# Patient Record
Sex: Female | Born: 1970 | Race: Black or African American | Hispanic: No | Marital: Single | Smoking: Current every day smoker
Health system: Southern US, Community
[De-identification: ages and names within clinical notes are randomized; demographics above are authoritative.]

## PROBLEM LIST (undated history)

## (undated) DIAGNOSIS — D259 Leiomyoma of uterus, unspecified: Secondary | ICD-10-CM

## (undated) DIAGNOSIS — R011 Cardiac murmur, unspecified: Secondary | ICD-10-CM

## (undated) DIAGNOSIS — I10 Essential (primary) hypertension: Secondary | ICD-10-CM

## (undated) DIAGNOSIS — Z973 Presence of spectacles and contact lenses: Secondary | ICD-10-CM

## (undated) DIAGNOSIS — B999 Unspecified infectious disease: Secondary | ICD-10-CM

## (undated) DIAGNOSIS — D649 Anemia, unspecified: Secondary | ICD-10-CM

## (undated) HISTORY — PX: DILATION AND CURETTAGE OF UTERUS: SHX78

---

## 2012-02-22 ENCOUNTER — Encounter (HOSPITAL_COMMUNITY): Payer: Self-pay | Admitting: Emergency Medicine

## 2012-02-22 ENCOUNTER — Emergency Department (HOSPITAL_COMMUNITY): Payer: Self-pay

## 2012-02-22 ENCOUNTER — Emergency Department (HOSPITAL_COMMUNITY)
Admission: EM | Admit: 2012-02-22 | Discharge: 2012-02-22 | Disposition: A | Discharge status: Home / Self Care | Payer: Self-pay | Attending: Emergency Medicine | Admitting: Emergency Medicine

## 2012-02-22 DIAGNOSIS — A599 Trichomoniasis, unspecified: Secondary | ICD-10-CM

## 2012-02-22 DIAGNOSIS — D649 Anemia, unspecified: Secondary | ICD-10-CM

## 2012-02-22 DIAGNOSIS — N200 Calculus of kidney: Secondary | ICD-10-CM

## 2012-02-22 DIAGNOSIS — A499 Bacterial infection, unspecified: Secondary | ICD-10-CM | POA: Insufficient documentation

## 2012-02-22 DIAGNOSIS — B9689 Other specified bacterial agents as the cause of diseases classified elsewhere: Secondary | ICD-10-CM

## 2012-02-22 DIAGNOSIS — R911 Solitary pulmonary nodule: Secondary | ICD-10-CM

## 2012-02-22 DIAGNOSIS — R109 Unspecified abdominal pain: Secondary | ICD-10-CM | POA: Insufficient documentation

## 2012-02-22 DIAGNOSIS — N760 Acute vaginitis: Secondary | ICD-10-CM | POA: Insufficient documentation

## 2012-02-22 LAB — CBC
HCT: 25.6 % — ABNORMAL LOW (ref 36.0–46.0)
Hemoglobin: 6.8 g/dL — CL (ref 12.0–15.0)
MCH: 17.6 pg — ABNORMAL LOW (ref 26.0–34.0)
MCH: 17.9 pg — ABNORMAL LOW (ref 26.0–34.0)
MCHC: 27.2 g/dL — ABNORMAL LOW (ref 30.0–36.0)
MCV: 65.5 fL — ABNORMAL LOW (ref 78.0–100.0)
Platelets: 775 10*3/uL — ABNORMAL HIGH (ref 150–400)
Platelets: 745 10*3/uL — ABNORMAL HIGH (ref 150–400)
RDW: 19.2 % — ABNORMAL HIGH (ref 11.5–15.5)
RDW: 19.4 % — ABNORMAL HIGH (ref 11.5–15.5)

## 2012-02-22 LAB — URINALYSIS, ROUTINE W REFLEX MICROSCOPIC
Protein, ur: 100 mg/dL — AB
Specific Gravity, Urine: 1.023 (ref 1.005–1.030)
Urobilinogen, UA: 0.2 mg/dL (ref 0.0–1.0)

## 2012-02-22 LAB — COMPREHENSIVE METABOLIC PANEL
AST: 13 U/L (ref 0–37)
Albumin: 3.3 g/dL — ABNORMAL LOW (ref 3.5–5.2)
BUN: 6 mg/dL (ref 6–23)
Calcium: 8.8 mg/dL (ref 8.4–10.5)
Creatinine, Ser: 0.6 mg/dL (ref 0.50–1.10)
GFR calc non Af Amer: >90 mL/min (ref >90)

## 2012-02-22 LAB — URINE MICROSCOPIC-ADD ON

## 2012-02-22 LAB — POCT PREGNANCY, URINE: Preg Test, Ur: NEGATIVE

## 2012-02-22 LAB — WET PREP, GENITAL: Yeast Wet Prep HPF POC: NONE SEEN

## 2012-02-22 MED ORDER — METRONIDAZOLE 500 MG PO TABS
2000.0000 mg | ORAL_TABLET | Freq: Once | ORAL | Status: AC
  Administered 2012-02-22: 2000 mg via ORAL
  Filled 2012-02-22: qty 4

## 2012-02-22 MED ORDER — HYDROMORPHONE HCL PF 1 MG/ML IJ SOLN
1.0000 mg | Freq: Once | INTRAMUSCULAR | Status: AC
  Administered 2012-02-22: 1 mg via INTRAVENOUS
  Filled 2012-02-22: qty 1

## 2012-02-22 MED ORDER — ONDANSETRON HCL 4 MG PO TABS: 4.0000 mg | ORAL_TABLET | Freq: Three times a day (TID) | ORAL | Status: AC | PRN

## 2012-02-22 MED ORDER — LIDOCAINE HCL (PF) 1 % IJ SOLN
INTRAMUSCULAR | Status: AC
  Filled 2012-02-22: qty 5

## 2012-02-22 MED ORDER — ONDANSETRON HCL 4 MG/2ML IJ SOLN
4.0000 mg | Freq: Once | INTRAMUSCULAR | Status: AC
  Administered 2012-02-22: 4 mg via INTRAVENOUS
  Filled 2012-02-22: qty 2

## 2012-02-22 MED ORDER — KETOROLAC TROMETHAMINE 10 MG PO TABS: 10.0000 mg | ORAL_TABLET | Freq: Four times a day (QID) | ORAL | Status: AC | PRN

## 2012-02-22 MED ORDER — OXYCODONE-ACETAMINOPHEN 5-325 MG PO TABS: 1.0000 | ORAL_TABLET | ORAL | Status: AC | PRN

## 2012-02-22 MED ORDER — AZITHROMYCIN 250 MG PO TABS
1000.0000 mg | ORAL_TABLET | Freq: Once | ORAL | Status: AC
  Administered 2012-02-22: 1000 mg via ORAL
  Filled 2012-02-22: qty 4

## 2012-02-22 MED ORDER — SODIUM CHLORIDE 0.9 % IV BOLUS (SEPSIS)
1000.0000 mL | Freq: Once | INTRAVENOUS | Status: AC
  Administered 2012-02-22: 1000 mL via INTRAVENOUS

## 2012-02-22 MED ORDER — METRONIDAZOLE 500 MG PO TABS: 500.0000 mg | ORAL_TABLET | Freq: Two times a day (BID) | ORAL | Status: AC

## 2012-02-22 MED ORDER — TAMSULOSIN HCL 0.4 MG PO CAPS: 0.4000 mg | ORAL_CAPSULE | Freq: Two times a day (BID) | ORAL | Status: DC

## 2012-02-22 MED ORDER — CEFTRIAXONE SODIUM 250 MG IJ SOLR
125.0000 mg | Freq: Once | INTRAMUSCULAR | Status: AC
  Administered 2012-02-22: 125 mg via INTRAMUSCULAR
  Filled 2012-02-22: qty 250

## 2012-02-22 NOTE — ED Notes (Signed)
The pt is c/o lt sided abd pain since 0045 this am with nv and diarrhea.  lmp now.  At present no distress.  No vaginal discharge.  Alert oriented skin warm and dry

## 2012-02-22 NOTE — ED Notes (Signed)
C/o L sided abd pain with n/v/d since 12:45pm today.  Denies urinary complaints.

## 2012-02-22 NOTE — ED Notes (Signed)
Up to the br with assistance.  zithromax not given at this time .  Nausea is a large factor

## 2012-02-22 NOTE — ED Provider Notes (Signed)
History     CSN: [id number]  Arrival date & time 02/22/12  1558   First MD Initiated Contact with Patient 02/22/12 1838      Chief Complaint  Patient presents with  . Abdominal Pain    (Consider location/radiation/quality/duration/timing/severity/associated sxs/prior treatment) The history is provided by the patient.    41 y/o female INAD c/o left flank and LUQ pain followed by 3x episodes NBNB vomiting and diarrhea onset this morning. Denies Sick contacts, and recent travel. Affirms subjective fever and chills. Patient has had several transfusions in the past. She normally takes iron supplementation but has not taken it recently. She is very heavy periods/  History reviewed. No pertinent past medical history.  Past Surgical History  Procedure Date  . Tubal ligation   . Cesarean section     No family history on file.  History  Substance Use Topics  . Smoking status: Current Everyday Smoker  . Smokeless tobacco: Not on file  . Alcohol Use: Yes    OB History    Grav Para Term Preterm Abortions TAB SAB Ect Mult Living                  Review of Systems  Constitutional: Positive for fever and chills.  Cardiovascular: Negative for chest pain.  Gastrointestinal: Negative for abdominal pain.  Genitourinary: Positive for flank pain. Negative for dysuria and frequency.  Skin: Negative for rash.  All other systems reviewed and are negative.    Allergies  Review of patient's allergies indicates no known allergies.  Home Medications   Current Outpatient Rx  Name Route Sig Dispense Refill  . NAPROXEN SODIUM 220 MG PO TABS Oral Take 220 mg by mouth 2 (two) times daily with a meal. For pain      BP 141/70  Pulse 96  Temp 98.6 F (37 C) (Oral)  Resp 16  SpO2 100%  LMP 02/18/2012  Physical Exam  Vitals reviewed. Constitutional: She is oriented to person, place, and time. She appears well-developed and well-nourished. No distress.  HENT:  Head: Normocephalic.    Eyes: Conjunctivae and EOM are normal. Pupils are equal, round, and reactive to light.  Neck: Normal range of motion.  Cardiovascular: Normal rate, regular rhythm and normal heart sounds.   Pulmonary/Chest: Effort normal and breath sounds normal.  Abdominal: Bowel sounds are normal. She exhibits no distension and no mass. There is no tenderness. There is no rebound and no guarding.       Mild left CVA tenderness.  Genitourinary: There is no rash, tenderness or lesion on the right labia. There is no rash, tenderness or lesion on the left labia. Uterus is not enlarged. Cervix exhibits no motion tenderness, no discharge and no friability. Right adnexum displays no mass, no tenderness and no fullness. Left adnexum displays no mass, no tenderness and no fullness. No erythema or tenderness around the vagina. No vaginal discharge found.  Musculoskeletal: Normal range of motion.  Neurological: She is alert and oriented to person, place, and time.  Psychiatric: She has a normal mood and affect.    ED Course  Procedures (including critical care time)  Labs Reviewed  CBC - Abnormal; Notable for the following:    WBC 13.6 (*)     Hemoglobin 6.9 (*)     HCT 25.6 (*)     MCV 65.5 (*)     MCH 17.6 (*)     MCHC 27.0 (*)     RDW 19.2 (*)  Platelets 775 (*)     All other components within normal limits  COMPREHENSIVE METABOLIC PANEL - Abnormal; Notable for the following:    Glucose, Bld 175 (*)     Albumin 3.3 (*)     Total Bilirubin 0.1 (*)     All other components within normal limits  URINALYSIS, ROUTINE W REFLEX MICROSCOPIC - Abnormal; Notable for the following:    APPearance CLOUDY (*)     Glucose, UA 100 (*)     Hgb urine dipstick LARGE (*)     Protein, ur 100 (*)     Leukocytes, UA MODERATE (*)     All other components within normal limits  URINE MICROSCOPIC-ADD ON - Abnormal; Notable for the following:    Squamous Epithelial / LPF MANY (*)     Bacteria, UA FEW (*)     All other  components within normal limits  POCT PREGNANCY, URINE   Ct Abdomen Pelvis Wo Contrast  02/22/2012  *RADIOLOGY REPORT*  Clinical Data: Abdominal pain.  Left-sided flank pain.  Nausea and vomiting.  CT ABDOMEN AND PELVIS WITHOUT CONTRAST  Technique:  Multidetector CT imaging of the abdomen and pelvis was performed following the standard protocol without intravenous contrast.  Comparison: No priors.  Findings:  Lung Bases: Two small pulmonary nodules in the left base, the largest of which measures 5 mm (image 13 of series 102) in the anterior aspect of the left lower lobe.  There is also a 4 mm nodule in the periphery of the left lower lobe (image four of series 102).  Abdomen/Pelvis:  Image 85 of series 2 demonstrates a tiny 3 mm calculus at the left ureterovesicular junction.  There is mild proximal left hydroureteronephrosis and a small amount of left- sided perinephric stranding, indicative of mild obstruction.  On the right, there is a 4 mm calculus in the lower pole collecting system of the right kidney, but no right ureteral calculi or signs of right-sided obstruction.  No calculi within the lumen of the urinary bladder.  The unenhanced appearance of the liver, gallbladder, pancreas, spleen and bilateral adrenal glands is unremarkable.  Normal appendix.  No ascites or pneumoperitoneum and no pathologic distension of bowel.  No definite pathologic lymphadenopathy identified within the abdomen or pelvis on this noncontrast CT examination.  The uterus appears enlarged and slightly heterogeneous in appearance, likely related to fibroids.  Ovaries are not well visualized.  Musculoskeletal: There are no aggressive appearing lytic or blastic lesions noted in the visualized portions of the skeleton.  IMPRESSION: 1.  3 mm partially obstructing calculus at the left ureterovesicular junction with mild left-sided hydroureteronephrosis and perinephric stranding. 2.  4 mm nonobstructive calculus in the lower pole  collecting system of the right kidney. 3.  Probable fibroid uterus. 4.  Normal appendix. 5.  Nonspecific 4 mm and 5 mm pulmonary nodules in the left lower lobe, as above. If the patient is at high risk for bronchogenic carcinoma, follow-up chest CT at 6-12 months is recommended.  If the patient is at low risk for bronchogenic carcinoma, follow-up chest CT at 12 months is recommended.  This recommendation follows the consensus statement: Guidelines for Management of Small Pulmonary Nodules Detected on CT Scans: A Statement from the Fleischner Society as published in Radiology 2005; 237:395-400.  Original Report Authenticated By: Florencia Reasons, M.D.     1. Kidney stone   2. Anemia   3. Bacterial vaginosis   4. Trichomoniasis   5. Pulmonary nodule  MDM  Suspect viral gastroenteritis vs Kidney stone as pain is more in flank radiating to LUQ.   UA contaminated, I'll obtain stone protocol CT. patient's pain and nausea are well controlled.  Urinalysis shows Trichomonas, I will treat this with 2 g of metronidazole. Pelvic exam clinically normal with no cervical motion tenderness. Wet prep shows white blood cells and clue cells. For this reason I will treat her for cervicitis, and bacterial vaginosis.  Repeat CBC pending.  Repeat HGB 6.8, S. patient is asymptomatic at this time there is no need for transfusion. I will advise the patient to resume taking her iron supplementation.  CT showed pulmonary nodules. Patient is a smoker spent extensive time counseling on patient smoking cessation I advised patient that she needed a followup CT in 6 months.  Extensive discharge counseling was spent explaining her diagnoses and medications, return precautions and appropriate followups. Patient repeated to me return precautions and time frame for reevaluation. Pt verbalized understanding and agrees with care plan. Outpatient follow-up and return precautions given.             Wynetta Emery, PA-C 02/23/12 [phone]

## 2012-02-22 NOTE — ED Notes (Signed)
The pt says her hgb is always low.

## 2012-02-22 NOTE — ED Notes (Signed)
The pt just returned from xray 

## 2012-02-22 NOTE — ED Notes (Signed)
Pain and nausea med given 

## 2012-02-22 NOTE — ED Notes (Signed)
Per lab, critical Hgb 6.9.

## 2012-02-22 NOTE — ED Notes (Signed)
The pt was given po meds gingerale given

## 2012-02-22 NOTE — ED Notes (Signed)
Pelvic cart at the bedside 

## 2012-02-22 NOTE — ED Notes (Signed)
The pts pain is better 

## 2012-02-23 LAB — GC/CHLAMYDIA PROBE AMP, GENITAL: Chlamydia, DNA Probe: NEGATIVE

## 2012-02-23 LAB — RPR: RPR Ser Ql: NONREACTIVE

## 2012-02-23 NOTE — ED Provider Notes (Signed)
Medical screening examination/treatment/procedure(s) were performed by non-physician practitioner and as supervising physician I was immediately available for consultation/collaboration.  Ethelda Chick, MD 02/23/12 [phone]

## 2014-03-04 ENCOUNTER — Ambulatory Visit (INDEPENDENT_AMBULATORY_CARE_PROVIDER_SITE_OTHER): Payer: Self-pay | Admitting: Cardiology

## 2014-03-04 ENCOUNTER — Encounter: Payer: Self-pay | Admitting: Cardiology

## 2014-03-04 ENCOUNTER — Ambulatory Visit: Payer: Self-pay | Admitting: Cardiology

## 2014-03-04 VITALS — BP 160/90 | HR 105 | Ht 62.0 in | Wt 193.0 lb

## 2014-03-04 DIAGNOSIS — R011 Cardiac murmur, unspecified: Secondary | ICD-10-CM | POA: Insufficient documentation

## 2014-03-04 DIAGNOSIS — I10 Essential (primary) hypertension: Secondary | ICD-10-CM

## 2014-03-04 NOTE — Progress Notes (Signed)
   HPI The patient was referred for evaluation of a murmur. 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  She says she's never had any cardiac workup. She doesn't exercise routinely but she is active particularly in her job. With this she denies any cardiovascular symptoms.  The patient denies any new symptoms such as chest discomfort, neck or arm discomfort. There has been no new shortness of breath, PND or orthopnea. There have been no reported palpitations, presyncope or syncope.  No Known Allergies  Meds:  None    Past Medical History  Diagnosis Date  . HTN (hypertension)     Borderline    Past Surgical History  Procedure Laterality Date  . Tubal ligation    . Cesarean section      Family History  Problem Relation Age of Onset  . Heart disease Mother     Pacemaker  . Hypertension Mother     History   Social History  . Marital Status: Single    Spouse Name: N/A    Number of Children: 3  . Years of Education: N/A   Occupational History  . Not on file.   Social History Main Topics  . Smoking status: Current Every Day Smoker -- 0.50 packs/day for 25 years    Types: Cigarettes  . Smokeless tobacco: Not on file  . Alcohol Use: Yes  . Drug Use: No  . Sexual Activity: Not on file   Other Topics Concern  . 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     ROS:  As stated in the HPI and negative for all other systems.   PHYSICAL EXAM BP 160/90  Pulse 105  Ht 5\' 2"  (1.575 m)  Wt 193 lb (87.544 kg)  BMI 35.29 kg/m2 GENERAL:  Well appearing HEENT:  Pupils equal round and reactive, fundi not visualized, oral mucosa unremarkable NECK:  No jugular venous distention, waveform within normal limits, carotid upstroke brisk and symmetric, no bruits, no thyromegaly LYMPHATICS:  No cervical, inguinal adenopathy LUNGS:  Clear to auscultation  bilaterally BACK:  No CVA tenderness CHEST:  Unremarkable HEART:  PMI not displaced or sustained,S1 and S2 within normal limits, no S3, no S4, no clicks, no rubs,  2/6 apical early peaking systolic murmur heard best  At the apex and right upper sternal border but without changing Valsalva, n diastolic murmurs ABD:  Flat, positive bowel sounds normal in frequency in pitch, no bruits, no rebound, no guarding, no midline pulsatile mass, no hepatomegaly, no splenomegaly EXT:  2 plus pulses throughout, no edema, no cyanosis no clubbing SKIN:  No rashes no nodules NEURO:  Cranial nerves II through XII grossly intact, motor grossly intact throughout PSYCH:  Cognitively intact, oriented to person place and time   EKG:   Sinus rhythm, rate 105 , axis within normal limits , intervals within normal limits, no specific T-wave flattening. h  ASSESSMENT AND PLAN  MURMUR:   We will followup with an echocardiogram. 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  HTN:   Her blood pressure is slightly elevated and she will keep a blood pressure diary. We talked about therapeutic lifestyle changes.  TOBACCO ABUSE:   We did discuss the need to stop smoking and strategies.

## 2014-03-04 NOTE — Patient Instructions (Signed)
Your physician recommends that you schedule a follow-up appointment in: one year with Dr. Percival Spanish  We are ordering an echo to look into your heart murmur

## 2014-03-05 ENCOUNTER — Other Ambulatory Visit (HOSPITAL_COMMUNITY): Payer: Self-pay | Admitting: Cardiology

## 2014-03-05 ENCOUNTER — Ambulatory Visit (HOSPITAL_COMMUNITY): Payer: Self-pay | Attending: Cardiology | Admitting: Cardiology

## 2014-03-05 DIAGNOSIS — R011 Cardiac murmur, unspecified: Secondary | ICD-10-CM

## 2014-03-05 DIAGNOSIS — I519 Heart disease, unspecified: Secondary | ICD-10-CM | POA: Insufficient documentation

## 2014-03-05 HISTORY — PX: TRANSTHORACIC ECHOCARDIOGRAM: SHX275

## 2014-03-05 NOTE — Progress Notes (Signed)
Echo performed. 

## 2014-03-08 ENCOUNTER — Telehealth: Payer: Self-pay | Admitting: Internal Medicine

## 2014-03-08 NOTE — Telephone Encounter (Signed)
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 She would like it as soon as possible so she can return to work. Please call  Thanks

## 2014-03-08 NOTE — Telephone Encounter (Signed)
Spoke with pt, copy of dr Debara Pickett ov from 03-04-14 and copy of recent echo placed at the front desk for patient pick up.

## 2014-07-23 DIAGNOSIS — R011 Cardiac murmur, unspecified: Secondary | ICD-10-CM

## 2014-07-23 HISTORY — DX: Cardiac murmur, unspecified: R01.1

## 2015-03-15 ENCOUNTER — Ambulatory Visit (INDEPENDENT_AMBULATORY_CARE_PROVIDER_SITE_OTHER): Payer: Commercial Managed Care - PPO | Admitting: Cardiology

## 2015-03-15 ENCOUNTER — Encounter: Payer: Self-pay | Admitting: Cardiology

## 2015-03-15 VITALS — BP 124/84 | HR 103 | Ht 62.0 in | Wt 200.6 lb

## 2015-03-15 DIAGNOSIS — I10 Essential (primary) hypertension: Secondary | ICD-10-CM | POA: Diagnosis not present

## 2015-03-15 DIAGNOSIS — R011 Cardiac murmur, unspecified: Secondary | ICD-10-CM | POA: Diagnosis not present

## 2015-03-15 NOTE — Progress Notes (Signed)
   HPI The patient was referred for evaluation of a murmur. I saw her last year for this. Echocardiogram was unremarkable. In the past year she's had no new cardiovascular events. The patient denies any new symptoms such as chest discomfort, neck or arm discomfort. There has been no new shortness of breath, PND or orthopnea. There have been no reported palpitations, presyncope or syncope.  She did stop smoking for a while but had some stress in her life and started again.    No Known Allergies  Meds:  Iron    Past Medical History  Diagnosis Date  . HTN (hypertension)     Borderline    Past Surgical History  Procedure Laterality Date  . Tubal ligation    . Cesarean section     ROS:  As stated in the HPI and negative for all other systems.   PHYSICAL EXAM BP 124/84 mmHg  Pulse 103  Ht 5\' 2"  (1.575 m)  Wt 200 lb 9 oz (90.975 kg)  BMI 36.67 kg/m2  LMP 03/15/2015 HEENT:  Pupils equal round and reactive, fundi not visualized, oral mucosa unremarkable NECK:  No jugular venous distention, waveform within normal limits, carotid upstroke brisk and symmetric, no bruits, no thyromegaly LUNGS:  Clear to auscultation bilaterally BACK:  No CVA tenderness HEART:  PMI not displaced or sustained,S1 and S2 within normal limits, no S3, no S4, no clicks, no rubs,  2/6 apical early peaking systolic murmur heard best  At the apex and right upper sternal border but without changing Valsalva, no diastolic murmurs ABD:  Flat, positive bowel sounds normal in frequency in pitch, no bruits, no rebound, no guarding, no midline pulsatile mass, no hepatomegaly, no splenomegaly EXT:  2 plus pulses throughout, no edema, no cyanosis no clubbing   EKG:   Sinus rhythm, rate 103, axis within normal limits , intervals within normal limits, no specific T-wave flattening. h  ASSESSMENT AND PLAN  MURMUR:  This sounds like a flow murmur. The echocardiogram last she was unremarkable. No further imaging is  indicated.  HTN:   The blood pressure is well controlled. No change in therapy is indicated.  TOBACCO ABUSE:   The patient again will try to stop smoking and we discussed strategies.

## 2015-03-15 NOTE — Patient Instructions (Signed)
Your physician recommends that you schedule a follow-up appointment in: As Needed    

## 2015-03-31 ENCOUNTER — Encounter (HOSPITAL_BASED_OUTPATIENT_CLINIC_OR_DEPARTMENT_OTHER): Payer: Self-pay | Admitting: *Deleted

## 2015-04-01 ENCOUNTER — Encounter (HOSPITAL_BASED_OUTPATIENT_CLINIC_OR_DEPARTMENT_OTHER): Payer: Self-pay | Admitting: *Deleted

## 2015-04-01 NOTE — H&P (Signed)
NAMETONIANNE, Christie [name] NO.:  [id number]  MEDICAL RECORD NO.:  [id number]  LOCATION:                               FACILITY:  [facility]  PHYSICIAN:  Darlyn Chamber, M.D.   DATE OF BIRTH:  06/28/1971  DATE OF ADMISSION:  04/06/2015 DATE OF DISCHARGE:                             HISTORY & PHYSICAL   DATE OF SURGERY:  September 14.  The surgery is at [facility].  The patient is a 44 year old, gravida 3, para 9 female, who presents for hysteroscopy with my MyoSure resection of a possible intrauterine fibroid as well as hydrothermal ablation.  The patient has been having trouble with increasing menstrual flow.  She has 5 days of flow.  Three days were heavy changing pads every hour with clotting and cramping. She has required a transfusion in the past for this.  She has had a previous bilateral tubal ligation.  We did do an ultrasound here in the office.  Ultrasound did show a fundal fibroid measuring 6.7 cm.  She had other smaller intramural fibroids and she had one inside the uterine cavity, measuring 1.5 cm.  She now presents for hysteroscopy with MyoSure resection of the fibroid with subsequent hydrothermal ablation.  ALLERGIES:  She has no known drug allergies.  MEDICATIONS:  Include; iron sulfate supplementation.  PAST MEDICAL HISTORY:  Usual childhood diseases.  No significant sequelae.  She has had 3 prior cesarean sections and a bilateral tubal ligation with the last one.  SOCIAL HISTORY:  Half pack per day tobacco use.  No alcohol use.  FAMILY HISTORY:  Noncontributory.  REVIEW OF SYSTEMS:  Noncontributory.  PHYSICAL EXAMINATION:  VITAL SIGNS:  The patient is afebrile with stable vital signs. HEENT:  The patient is normocephalic.  Pupils equal, round, and reactive to light and accommodation.  Extraocular is intact.  Sclerae clear. Oropharynx clear. NECK:  Without thyromegaly. BREASTS:  Not examined. LUNGS:   Clear. CARDIAC SYSTEM:  Regular rate.  No murmurs or gallops. ABDOMEN:  Benign.  No mass, organomegaly, or tenderness. PELVIC:  External genitalia is clear.  Cervix unremarkable.  Uterus approximately 9 weeks in size.  Adnexa unremarkable.  IMPRESSION:  Menorrhagia, secondary to uterine fibroid with a possible intrauterine fibroids.  PLAN:  The patient will undergo hysteroscopy with a MyoSure resection of the fibroid along with hydrothermal ablation.  The nature of the procedure have been discussed.  Risks have been explained, including the risk of infection.  The risk of hemorrhage that could require transfusion with the risks of AIDS or hepatitis.  Excessive bleeding could require hysterectomy.  There is a risk of perforation of injury to adjacent organs such as bowel that could require further exploratory surgery.  Risk of deep venous thrombosis and pulmonary limits and pulmonary embolus.  Other alternatives have been discussed.  This could include the use of the IUD versus hysterectomy.  Right now, she will proceed with the above-noted surgery as mentioned.  She does understand potential risks and complications.     Darlyn Chamber, M.D.     JSM/MEDQ  D:  04/01/2015  T:  04/01/2015  Job:  [id number]

## 2015-04-01 NOTE — Progress Notes (Signed)
NPO AFTER MN.  ARRIVE AT 0700. PT TO GET LAB WORK DONE ON TUESDAY 04-05-2015 1430.

## 2015-04-01 NOTE — H&P (Signed)
  Patient name Christie, [name] DICTATION# [id number] CSN# [id number]  [facility], MD 04/01/2015 7:49 AM

## 2015-04-05 NOTE — Anesthesia Preprocedure Evaluation (Addendum)
Anesthesia Evaluation  Patient identified by MRN, date of birth, ID band Patient awake    Reviewed: Allergy & Precautions, H&P , NPO status , Patient's Chart, lab work & pertinent test results  Airway Mallampati: II  TM Distance: >3 FB Neck ROM: full    Dental  (+) Dental Advisory Given, Chipped Large chip left front upper tooth:   Pulmonary Current Smoker,    Pulmonary exam normal breath sounds clear to auscultation       Cardiovascular Exercise Tolerance: Good hypertension, Normal cardiovascular exam Rhythm:regular Rate:Normal     Neuro/Psych negative neurological ROS  negative psych ROS   GI/Hepatic negative GI ROS, Neg liver ROS,   Endo/Other  negative endocrine ROS  Renal/GU negative Renal ROS  negative genitourinary   Musculoskeletal   Abdominal   Peds  Hematology negative hematology ROS (+)   Anesthesia Other Findings   Reproductive/Obstetrics negative OB ROS                            Anesthesia Physical Anesthesia Plan  ASA: II  Anesthesia Plan: General   Post-op Pain Management:    Induction: Intravenous  Airway Management Planned: LMA  Additional Equipment:   Intra-op Plan:   Post-operative Plan:   Informed Consent: I have reviewed the patients History and Physical, chart, labs and discussed the procedure including the risks, benefits and alternatives for the proposed anesthesia with the patient or authorized representative who has indicated his/her understanding and acceptance.   Dental Advisory Given  Plan Discussed with: CRNA and Surgeon  Anesthesia Plan Comments:         Anesthesia Quick Evaluation

## 2015-04-06 ENCOUNTER — Encounter (HOSPITAL_BASED_OUTPATIENT_CLINIC_OR_DEPARTMENT_OTHER)
Admission: RE | Disposition: A | Discharge status: Home / Self Care | Payer: Self-pay | Source: Ambulatory Visit | Attending: Obstetrics and Gynecology

## 2015-04-06 ENCOUNTER — Ambulatory Visit (HOSPITAL_BASED_OUTPATIENT_CLINIC_OR_DEPARTMENT_OTHER)
Admission: RE | Admit: 2015-04-06 | Discharge: 2015-04-06 | Disposition: A | Discharge status: Home / Self Care | Payer: Commercial Managed Care - PPO | Source: Ambulatory Visit | Attending: Obstetrics and Gynecology | Admitting: Obstetrics and Gynecology

## 2015-04-06 ENCOUNTER — Encounter (HOSPITAL_BASED_OUTPATIENT_CLINIC_OR_DEPARTMENT_OTHER): Payer: Self-pay | Admitting: *Deleted

## 2015-04-06 ENCOUNTER — Ambulatory Visit (HOSPITAL_BASED_OUTPATIENT_CLINIC_OR_DEPARTMENT_OTHER): Payer: Commercial Managed Care - PPO | Admitting: Anesthesiology

## 2015-04-06 DIAGNOSIS — N840 Polyp of corpus uteri: Secondary | ICD-10-CM | POA: Insufficient documentation

## 2015-04-06 DIAGNOSIS — N920 Excessive and frequent menstruation with regular cycle: Secondary | ICD-10-CM | POA: Diagnosis present

## 2015-04-06 DIAGNOSIS — I10 Essential (primary) hypertension: Secondary | ICD-10-CM | POA: Insufficient documentation

## 2015-04-06 DIAGNOSIS — F17200 Nicotine dependence, unspecified, uncomplicated: Secondary | ICD-10-CM | POA: Diagnosis not present

## 2015-04-06 DIAGNOSIS — D250 Submucous leiomyoma of uterus: Secondary | ICD-10-CM | POA: Insufficient documentation

## 2015-04-06 DIAGNOSIS — D259 Leiomyoma of uterus, unspecified: Secondary | ICD-10-CM | POA: Diagnosis present

## 2015-04-06 HISTORY — DX: Anemia, unspecified: D64.9

## 2015-04-06 HISTORY — DX: Presence of spectacles and contact lenses: Z97.3

## 2015-04-06 HISTORY — DX: Leiomyoma of uterus, unspecified: D25.9

## 2015-04-06 HISTORY — PX: DILITATION & CURRETTAGE/HYSTROSCOPY WITH HYDROTHERMAL ABLATION: SHX5570

## 2015-04-06 HISTORY — DX: Cardiac murmur, unspecified: R01.1

## 2015-04-06 LAB — CBC
HEMATOCRIT: 35.1 % — AB (ref 36.0–46.0)
HEMOGLOBIN: 10.8 g/dL — AB (ref 12.0–15.0)
MCH: 25.2 pg — AB (ref 26.0–34.0)
MCHC: 30.8 g/dL (ref 30.0–36.0)
MCV: 82 fL (ref 78.0–100.0)
Platelets: 368 10*3/uL (ref 150–400)
RBC: 4.28 MIL/uL (ref 3.87–5.11)
WBC: 9.1 10*3/uL (ref 4.0–10.5)

## 2015-04-06 LAB — HCG, SERUM, QUALITATIVE: Preg, Serum: NEGATIVE

## 2015-04-06 SURGERY — DILATATION & CURETTAGE/HYSTEROSCOPY WITH HYDROTHERMAL ABLATION
Anesthesia: General

## 2015-04-06 MED ORDER — FENTANYL CITRATE (PF) 100 MCG/2ML IJ SOLN
25.0000 ug | INTRAMUSCULAR | Status: DC | PRN
  Filled 2015-04-06: qty 1

## 2015-04-06 MED ORDER — LACTATED RINGERS IV SOLN
INTRAVENOUS | Status: DC
  Administered 2015-04-06: 07:00:00 via INTRAVENOUS
  Filled 2015-04-06: qty 1000

## 2015-04-06 MED ORDER — ONDANSETRON HCL 4 MG/2ML IJ SOLN
INTRAMUSCULAR | Status: DC | PRN
Start: 2015-04-06 — End: 2015-04-06
  Administered 2015-04-06: 4 mg via INTRAVENOUS

## 2015-04-06 MED ORDER — GLYCINE 1.5 % IR SOLN
Status: DC | PRN
  Administered 2015-04-06: 3000 mL

## 2015-04-06 MED ORDER — DEXAMETHASONE SODIUM PHOSPHATE 4 MG/ML IJ SOLN
INTRAMUSCULAR | Status: DC | PRN
  Administered 2015-04-06: 10 mg via INTRAVENOUS

## 2015-04-06 MED ORDER — LIDOCAINE HCL (CARDIAC) 20 MG/ML IV SOLN
INTRAVENOUS | Status: DC | PRN
  Administered 2015-04-06: 80 mg via INTRAVENOUS

## 2015-04-06 MED ORDER — ACETAMINOPHEN 10 MG/ML IV SOLN
INTRAVENOUS | Status: DC | PRN
  Administered 2015-04-06: 1000 mg via INTRAVENOUS

## 2015-04-06 MED ORDER — FENTANYL CITRATE (PF) 100 MCG/2ML IJ SOLN
INTRAMUSCULAR | Status: AC
  Filled 2015-04-06: qty 4

## 2015-04-06 MED ORDER — FENTANYL CITRATE (PF) 100 MCG/2ML IJ SOLN
INTRAMUSCULAR | Status: DC | PRN
  Administered 2015-04-06 (×2): 50 ug via INTRAVENOUS

## 2015-04-06 MED ORDER — LIDOCAINE-EPINEPHRINE 1 %-1:100000 IJ SOLN
INTRAMUSCULAR | Status: DC | PRN
  Administered 2015-04-06: 20 mL

## 2015-04-06 MED ORDER — PROPOFOL 10 MG/ML IV BOLUS
INTRAVENOUS | Status: DC | PRN
  Administered 2015-04-06: 250 mg via INTRAVENOUS

## 2015-04-06 MED ORDER — LACTATED RINGERS IV SOLN
INTRAVENOUS | Status: DC
  Filled 2015-04-06: qty 1000

## 2015-04-06 MED ORDER — CEFAZOLIN SODIUM-DEXTROSE 2-3 GM-% IV SOLR
2.0000 g | INTRAVENOUS | Status: AC
  Administered 2015-04-06: 2 g via INTRAVENOUS
  Filled 2015-04-06: qty 50

## 2015-04-06 MED ORDER — MIDAZOLAM HCL 5 MG/5ML IJ SOLN
INTRAMUSCULAR | Status: DC | PRN
  Administered 2015-04-06: 2 mg via INTRAVENOUS

## 2015-04-06 MED ORDER — KETOROLAC TROMETHAMINE 30 MG/ML IJ SOLN
INTRAMUSCULAR | Status: DC | PRN
Start: 2015-04-06 — End: 2015-04-06
  Administered 2015-04-06: 30 mg via INTRAVENOUS

## 2015-04-06 MED ORDER — OXYCODONE-ACETAMINOPHEN 7.5-325 MG PO TABS: 1.0000 | ORAL_TABLET | ORAL | Status: DC | PRN

## 2015-04-06 MED ORDER — MIDAZOLAM HCL 2 MG/2ML IJ SOLN
INTRAMUSCULAR | Status: AC
Start: 2015-04-06 — End: 2015-04-06
  Filled 2015-04-06: qty 2

## 2015-04-06 MED ORDER — CEFAZOLIN SODIUM-DEXTROSE 2-3 GM-% IV SOLR
INTRAVENOUS | Status: AC
  Filled 2015-04-06: qty 50

## 2015-04-06 SURGICAL SUPPLY — 35 items
ABLATOR ENDOMETRIAL BIPOLAR (ABLATOR) ×3 IMPLANT
CANISTER SUCTION 2500CC (MISCELLANEOUS) ×3 IMPLANT
CATH ROBINSON RED A/P 16FR (CATHETERS) ×3 IMPLANT
CORD ACTIVE DISPOSABLE (ELECTRODE)
CORD ELECTRO ACTIVE DISP (ELECTRODE) IMPLANT
COVER BACK TABLE 60X90IN (DRAPES) ×3 IMPLANT
DEVICE MYOSURE LITE (MISCELLANEOUS) ×3 IMPLANT
DRAPE HYSTEROSCOPY (DRAPE) ×3 IMPLANT
DRAPE LG THREE QUARTER DISP (DRAPES) ×3 IMPLANT
DRSG TELFA 3X8 NADH (GAUZE/BANDAGES/DRESSINGS) ×3 IMPLANT
ELECT LOOP GYNE PRO 24FR (CUTTING LOOP)
ELECT REM PT RETURN 9FT ADLT (ELECTROSURGICAL) ×3
ELECT VAPORTRODE GRVD BAR (ELECTRODE) IMPLANT
ELECTRODE LOOP GYNE PRO 24FR (CUTTING LOOP) IMPLANT
ELECTRODE REM PT RTRN 9FT ADLT (ELECTROSURGICAL) ×1 IMPLANT
ELECTRODE ROLLER BARREL 22FR (ELECTROSURGICAL) IMPLANT
GLOVE BIO SURGEON STRL SZ 6.5 (GLOVE) ×2 IMPLANT
GLOVE BIO SURGEON STRL SZ7 (GLOVE) ×6 IMPLANT
GLOVE BIO SURGEONS STRL SZ 6.5 (GLOVE) ×1
GLOVE BIOGEL M 6.5 STRL (GLOVE) ×3 IMPLANT
GLOVE BIOGEL PI IND STRL 7.5 (GLOVE) ×1 IMPLANT
GLOVE BIOGEL PI INDICATOR 7.5 (GLOVE) ×2
GOWN STRL REUS W/ TWL LRG LVL3 (GOWN DISPOSABLE) ×2 IMPLANT
GOWN STRL REUS W/TWL LRG LVL3 (GOWN DISPOSABLE) ×4
LEGGING LITHOTOMY PAIR STRL (DRAPES) ×3 IMPLANT
NEEDLE SPNL 18GX3.5 QUINCKE PK (NEEDLE) ×3 IMPLANT
PACK BASIN DAY SURGERY FS (CUSTOM PROCEDURE TRAY) ×3 IMPLANT
PAD OB MATERNITY 4.3X12.25 (PERSONAL CARE ITEMS) ×3 IMPLANT
PAD PREP 24X48 CUFFED NSTRL (MISCELLANEOUS) ×3 IMPLANT
SET GENESYS HTA PROCERVA (MISCELLANEOUS) ×3 IMPLANT
SYR CONTROL 10ML LL (SYRINGE) ×3 IMPLANT
TOWEL OR 17X24 6PK STRL BLUE (TOWEL DISPOSABLE) ×6 IMPLANT
TUBING AQUILEX INFLOW (TUBING) ×3 IMPLANT
TUBING AQUILEX OUTFLOW (TUBING) ×3 IMPLANT
WATER STERILE IRR 500ML POUR (IV SOLUTION) ×3 IMPLANT

## 2015-04-06 NOTE — H&P (Signed)
  History and physical exam unchanged 

## 2015-04-06 NOTE — Anesthesia Procedure Notes (Signed)
Procedure Name: LMA Insertion Date/Time: 04/06/2015 8:16 AM Performed by: Bethena Roys T Pre-anesthesia Checklist: Patient identified, Emergency Drugs available, Suction available and Patient being monitored Patient Re-evaluated:Patient Re-evaluated prior to inductionOxygen Delivery Method: Circle System Utilized Preoxygenation: Pre-oxygenation with 100% oxygen Intubation Type: IV induction Ventilation: Mask ventilation without difficulty LMA: LMA with gastric port inserted LMA Size: 4.0 Number of attempts: 1 Placement Confirmation: positive ETCO2 Tube secured with: Tape Dental Injury: Teeth and Oropharynx as per pre-operative assessment

## 2015-04-06 NOTE — Discharge Instructions (Signed)

## 2015-04-06 NOTE — Transfer of Care (Signed)
Immediate Anesthesia Transfer of Care Note  Patient: Christie [name]  Procedure(s) Performed: Procedure(s): DILATATION & CURETTAGE/HYSTEROSCOPY WITH HYDROTHERMAL ABLATION and Myosure (N/A)  Patient Location: PACU  Anesthesia Type:General  Level of Consciousness: awake, alert  and oriented  Airway & Oxygen Therapy: Patient Spontanous Breathing and Patient connected to nasal cannula oxygen  Post-op Assessment: Report given to RN and Post -op Vital signs reviewed and stable  Post vital signs: Reviewed and stable  Last Vitals:  Filed Vitals:   04/06/15 0657  BP: 133/77  Pulse: 85  Temp: 37 C  Resp: 16    Complications: No apparent anesthesia complications

## 2015-04-06 NOTE — Brief Op Note (Signed)
04/06/2015  9:02 AM  PATIENT:  Christie [name]  44 y.o. female  PRE-OPERATIVE DIAGNOSIS:  fibroids  POST-OPERATIVE DIAGNOSIS:  Fibroids AND POLYPS  PROCEDURE:  Procedure(s): DILATATION & CURETTAGE/HYSTEROSCOPY WITH HYDROTHERMAL ABLATION and Myosure (N/A) failure of HTA PRECEDED WITH NOVASURE  SURGEON:  Surgeon(s) and Role:    * Arvella Nigh, MD - Primary  PHYSICIAN ASSISTANT:   ASSISTANTS: none   ANESTHESIA:   general and paracervical block  EBL:  Total I/O In: 250 [I.V.:250] Out: -   BLOOD ADMINISTERED:none  DRAINS: none   LOCAL MEDICATIONS USED:  XYLOCAINE   SPECIMEN:  Source of Specimen:  ENDOMETRIAL POLYP  DISPOSITION OF SPECIMEN:  PATHOLOGY  COUNTS:  YES  TOURNIQUET:  * No tourniquets in log *  DICTATION: .Other Dictation: Dictation Number [id number]  PLAN OF CARE: Discharge to home after PACU  PATIENT DISPOSITION:  PACU - hemodynamically stable.   Delay start of Pharmacological VTE agent (>24hrs) due to surgical blood loss or risk of bleeding: not applicable

## 2015-04-06 NOTE — Anesthesia Postprocedure Evaluation (Signed)
  Anesthesia Post-op Note  Patient: Christie [name]  Procedure(s) Performed: Procedure(s) (LRB): DILATATION & CURETTAGE/HYSTEROSCOPY WITH HYDROTHERMAL ABLATION, MYOSURE AND NOVASURE (N/A)  Patient Location: PACU  Anesthesia Type: General  Level of Consciousness: awake and alert   Airway and Oxygen Therapy: Patient Spontanous Breathing  Post-op Pain: mild  Post-op Assessment: Post-op Vital signs reviewed, Patient's Cardiovascular Status Stable, Respiratory Function Stable, Patent Airway and No signs of Nausea or vomiting  Last Vitals:  Filed Vitals:   04/06/15 1039  BP: 141/85  Pulse:   Temp: 36.7 C  Resp: 16    Post-op Vital Signs: stable   Complications: No apparent anesthesia complications

## 2015-04-07 ENCOUNTER — Encounter (HOSPITAL_BASED_OUTPATIENT_CLINIC_OR_DEPARTMENT_OTHER): Payer: Self-pay | Admitting: Obstetrics and Gynecology

## 2015-04-07 NOTE — Op Note (Signed)
NAMEMARKIAH, JANEWAY NO.:  [id number]  MEDICAL RECORD NO.:  [id number]  LOCATION:                               FACILITY:  [facility]  PHYSICIAN:  Darlyn Chamber, M.D.   DATE OF BIRTH:  [birth year]  DATE OF PROCEDURE:  04/06/2015 DATE OF DISCHARGE:  04/06/2015                              OPERATIVE REPORT   PREOPERATIVE DIAGNOSIS:  Menorrhagia secondary to uterine fibroids with possible endometrial fibroids.  POSTOPERATIVE DIAGNOSIS:  Menorrhagia secondary to uterine fibroids with possible endometrial fibroids with evidence of an intrauterine polyp.  OPERATIVE PROCEDURE:  Paracervical block.  Cervical dilatation. Hysteroscopic evaluation with MyoSure resection of endometrial polyp. Attempted hydrothermal ablation which failed, subsequent NovaSure ablation.  SURGEON:  Darlyn Chamber, M.D.  ANESTHESIA:  General with paracervical block.  ESTIMATED BLOOD LOSS:  Minimal.  PACKS:  None.  DRAINS:  None.  INTRAOPERATIVE BLOOD PLACED:  None.  COMPLICATIONS:  None.  INDICATION:  Dictated in history and physical.  PROCEDURE IN DETAIL:  The patient was taken to the OR and placed in supine position.  After satisfactory level of general anesthesia, the patient was placed in dorsal lithotomy position using the Allen stirrups.  The perineum and vagina were prepped out with Betadine and draped sterile field.  A speculum was placed in the vaginal vault.  The cervix sounded to approximately 11 cm.  Cervix serially dilated to a size 23 Pratt dilator.  Hysteroscope was hen introduced.  On visualization, she had some small submucosal fibroids, but only a small portion protruded into the intrauterine cavity.  She did have a polyp anteriorly.  We brought in MyoSure and resected the endometrial polyp. At this point in time, we removed the hysteroscope.  The hydrothermal ablation was brought in place.  We inserted, sealed the cervix around it and then began the procedure.  We  had 3 problems with too much fluid loss, and therefore the machine shut down at this point in time.  We attempted several times to re-seal the cervix and still could not stop from excessive fluid loss, although there was nothing obvious.  There were no signs of perforation.  We decided to proceed with NovaSure.  It was brought into place.  Again, we had a sounding length of 11 cm, cervical length of 5, given the cavity length of 6.  We inserted the NovaSure and properly deployed it.  The cavity width of 4.2 cm.  We passed the CO2 patency test.  Power setting was 139 ablation was undertaken for 50 seconds. The NovaSure was replaced and repeated hysteroscopy had fairly universal ablation.  No signs of perforation or other complications.  At this point in time, the single-tooth and speculum then removed.  The patient was taken out of the dorsal lithotomy position.  Once alert, extubated, transferred to recovery room in good condition.     Darlyn Chamber, M.D.     JSM/MEDQ  D:  04/06/2015  T:  04/07/2015  Job:  [id number]

## 2015-07-24 DIAGNOSIS — D649 Anemia, unspecified: Secondary | ICD-10-CM

## 2015-07-24 HISTORY — DX: Anemia, unspecified: D64.9

## 2016-10-08 ENCOUNTER — Encounter (HOSPITAL_COMMUNITY): Payer: Self-pay | Admitting: Emergency Medicine

## 2016-10-08 DIAGNOSIS — I10 Essential (primary) hypertension: Secondary | ICD-10-CM | POA: Insufficient documentation

## 2016-10-08 DIAGNOSIS — F17210 Nicotine dependence, cigarettes, uncomplicated: Secondary | ICD-10-CM | POA: Insufficient documentation

## 2016-10-08 DIAGNOSIS — Z79899 Other long term (current) drug therapy: Secondary | ICD-10-CM | POA: Insufficient documentation

## 2016-10-08 DIAGNOSIS — D259 Leiomyoma of uterus, unspecified: Secondary | ICD-10-CM | POA: Insufficient documentation

## 2016-10-08 LAB — CBC
HCT: 36.6 % (ref 36.0–46.0)
Hemoglobin: 12 g/dL (ref 12.0–15.0)
MCH: 28.8 pg (ref 26.0–34.0)
MCHC: 32.8 g/dL (ref 30.0–36.0)
MCV: 87.8 fL (ref 78.0–100.0)
Platelets: 505 10*3/uL — ABNORMAL HIGH (ref 150–400)
RBC: 4.17 MIL/uL (ref 3.87–5.11)
RDW: 14 % (ref 11.5–15.5)
WBC: 16.5 10*3/uL — ABNORMAL HIGH (ref 4.0–10.5)

## 2016-10-08 LAB — COMPREHENSIVE METABOLIC PANEL
ALBUMIN: 4.1 g/dL (ref 3.5–5.0)
ALK PHOS: 65 U/L (ref 38–126)
ALT: 12 U/L — ABNORMAL LOW (ref 14–54)
AST: 26 U/L (ref 15–41)
Anion gap: 7 (ref 5–15)
BUN: 7 mg/dL (ref 6–20)
CALCIUM: 9.2 mg/dL (ref 8.9–10.3)
CHLORIDE: 110 mmol/L (ref 101–111)
CO2: 23 mmol/L (ref 22–32)
CREATININE: 0.68 mg/dL (ref 0.44–1.00)
GFR calc non Af Amer: >60 mL/min (ref >60)
GLUCOSE: 148 mg/dL — AB (ref 65–99)
Potassium: 4 mmol/L (ref 3.5–5.1)
SODIUM: 140 mmol/L (ref 135–145)
Total Bilirubin: 0.5 mg/dL (ref 0.3–1.2)
Total Protein: 8.1 g/dL (ref 6.5–8.1)

## 2016-10-08 LAB — LIPASE, BLOOD: LIPASE: 21 U/L (ref 11–51)

## 2016-10-08 NOTE — ED Triage Notes (Addendum)
Patient c/o constant sharp RLQ pain x1 week. Reports nausea, but denies V/D. Ambulatory to triage. Denies chest pain and SOB. Denies urinary sx. Negative rebound tenderness.

## 2016-10-09 ENCOUNTER — Encounter (HOSPITAL_COMMUNITY): Payer: Self-pay

## 2016-10-09 ENCOUNTER — Emergency Department (HOSPITAL_COMMUNITY): Payer: Self-pay

## 2016-10-09 ENCOUNTER — Emergency Department (HOSPITAL_COMMUNITY)
Admission: EM | Admit: 2016-10-09 | Discharge: 2016-10-09 | Disposition: A | Discharge status: Home / Self Care | Payer: Self-pay | Attending: Emergency Medicine | Admitting: Emergency Medicine

## 2016-10-09 DIAGNOSIS — R1031 Right lower quadrant pain: Secondary | ICD-10-CM

## 2016-10-09 DIAGNOSIS — D259 Leiomyoma of uterus, unspecified: Secondary | ICD-10-CM

## 2016-10-09 DIAGNOSIS — R102 Pelvic and perineal pain: Secondary | ICD-10-CM

## 2016-10-09 LAB — URINALYSIS, ROUTINE W REFLEX MICROSCOPIC
BILIRUBIN URINE: NEGATIVE
GLUCOSE, UA: 50 mg/dL — AB
KETONES UR: NEGATIVE mg/dL
NITRITE: NEGATIVE
PH: 5 (ref 5.0–8.0)
PROTEIN: 30 mg/dL — AB
Specific Gravity, Urine: 1.02 (ref 1.005–1.030)

## 2016-10-09 LAB — POC URINE PREG, ED: PREG TEST UR: NEGATIVE

## 2016-10-09 LAB — I-STAT CG4 LACTIC ACID, ED: Lactic Acid, Venous: 1.83 mmol/L (ref 0.5–1.9)

## 2016-10-09 MED ORDER — IOPAMIDOL (ISOVUE-300) INJECTION 61%
INTRAVENOUS | Status: AC
  Administered 2016-10-09: 100 mL via INTRAVENOUS
  Filled 2016-10-09: qty 100

## 2016-10-09 MED ORDER — IBUPROFEN 800 MG PO TABS: 800.0000 mg | ORAL_TABLET | Freq: Three times a day (TID) | ORAL | 0 refills | Status: DC | PRN

## 2016-10-09 MED ORDER — IOPAMIDOL (ISOVUE-300) INJECTION 61%
100.0000 mL | Freq: Once | INTRAVENOUS | Status: AC | PRN
  Administered 2016-10-09: 100 mL via INTRAVENOUS

## 2016-10-09 MED ORDER — MORPHINE SULFATE (PF) 4 MG/ML IV SOLN
8.0000 mg | Freq: Once | INTRAVENOUS | Status: AC
  Administered 2016-10-09: 8 mg via INTRAVENOUS
  Filled 2016-10-09: qty 2

## 2016-10-09 MED ORDER — GADOBENATE DIMEGLUMINE 529 MG/ML IV SOLN
20.0000 mL | Freq: Once | INTRAVENOUS | Status: AC | PRN
  Administered 2016-10-09: 19 mL via INTRAVENOUS

## 2016-10-09 MED ORDER — SODIUM CHLORIDE 0.9 % IV BOLUS (SEPSIS)
1000.0000 mL | Freq: Once | INTRAVENOUS | Status: AC
  Administered 2016-10-09: 1000 mL via INTRAVENOUS

## 2016-10-09 MED ORDER — HYDROCODONE-ACETAMINOPHEN 5-325 MG PO TABS: 1.0000 | ORAL_TABLET | ORAL | 0 refills | Status: DC | PRN

## 2016-10-09 NOTE — ED Notes (Signed)
Patient transported to MRI 

## 2016-10-09 NOTE — ED Provider Notes (Addendum)
[facility] DEPT Provider Note   CSN: [id number] Arrival date & time: 10/08/16  2054  By signing my name below, I, Ethelle Lyon Long, attest that this documentation has been prepared under the direction and in the presence of Varney Biles, MD. Electronically Signed: Reinaldo Meeker, Scribe. 10/09/2016. 3:25 AM.  History   Chief Complaint Chief Complaint  Patient presents with  . Abdominal Pain   The history is provided by the patient and medical records. No language interpreter was used.    HPI Comments:  Christie [name] is an obese 46 y.o. female with a PMHx of Anemia and Heart Murmur, who presents to the Emergency Department complaining of constant, sharp, 10/10 RLQ abdominal pain onset one week. She was at [facility] earlier today who referred her here for continued testing. She states she has a h/o renal calculi but states this pain feels dissimilar from that. Pt has associated symptoms of nausea and back pain. She notes a h/o caesarean surgery and ovarian cyst. She tried tylenol at home with mild relief of her pain. No h/o ovarian tumors. Ambulation and direct pressure/ palpation exacerbate her pain. Pt denies vomiting, diarrhea, CP, SOB, dysuria, hematuria, vaginal discharge, and any other complaints at this time. Pt is a current every day smoker.    Past Medical History:  Diagnosis Date  . Anemia   . Heart murmur    flow murmur  . Uterine fibroid   . Wears glasses    Patient Active Problem List   Diagnosis Date Noted  . Menorrhagia 04/06/2015    Class: Present on Admission  . Uterine fibroid 04/06/2015    Class: Present on Admission  . Murmur 03/04/2014  . HTN (hypertension) 03/04/2014   Past Surgical History:  Procedure Laterality Date  . CESAREAN SECTION  x3  last one 1997   Bilateral Tubal Ligation w/ last one  . DILITATION & CURRETTAGE/HYSTROSCOPY WITH HYDROTHERMAL ABLATION N/A 04/06/2015   Procedure: DILATATION & CURETTAGE/HYSTEROSCOPY WITH HYDROTHERMAL ABLATION,  MYOSURE AND NOVASURE;  Surgeon: Arvella Nigh, MD;  Location: [facility];  Service: Gynecology;  Laterality: N/A;  . TRANSTHORACIC ECHOCARDIOGRAM  03-05-2014   mild LVH,  grade I diastolic dysfunction,  ef 65-70%/  trivial PR   OB History    No data available     Home Medications    Prior to Admission medications   Medication Sig Start Date End Date Taking? Authorizing Provider  acetaminophen (TYLENOL) 500 MG tablet Take 1,000 mg by mouth every 6 (six) hours as needed for mild pain.   Yes Historical Provider, MD  vitamin E 100 UNIT capsule Take 100 Units by mouth daily.   Yes Historical Provider, MD  HYDROcodone-acetaminophen (NORCO/VICODIN) 5-325 MG tablet Take 1-2 tablets by mouth every 4 (four) hours as needed. 10/09/16   Davonna Belling, MD  ibuprofen (ADVIL,MOTRIN) 800 MG tablet Take 1 tablet (800 mg total) by mouth every 8 (eight) hours as needed. 10/09/16   Davonna Belling, MD  oxyCODONE-acetaminophen (PERCOCET) 7.5-325 MG per tablet Take 1 tablet by mouth every 4 (four) hours as needed for severe pain. Patient not taking: Reported on 10/09/2016 04/06/15   Arvella Nigh, MD    Family History Family History  Problem Relation Age of Onset  . Heart disease Mother     Pacemaker  . Hypertension Mother     Social History Social History  Substance Use Topics  . Smoking status: Current Every Day Smoker    Packs/day: 0.50    Years: 25.00  Types: Cigarettes  . Smokeless tobacco: Never Used  . Alcohol use Yes     Comment: occasional     Allergies   Oxycodone   Review of Systems Review of Systems  Respiratory: Negative for shortness of breath.   Cardiovascular: Negative for chest pain.  Gastrointestinal: Positive for abdominal pain and nausea. Negative for diarrhea and vomiting.  Genitourinary: Negative for dysuria, hematuria and vaginal discharge.  Musculoskeletal: Positive for back pain.   10 Systems reviewed and are negative for acute change except as  noted in the HPI.   Physical Exam Updated Vital Signs BP 127/82   Pulse 83   Temp 98.7 F (37.1 C) (Oral)   Resp 18   Ht 5\' 2"  (1.575 m)   Wt 200 lb (90.7 kg)   LMP 07/10/2016 Comment: negative urine pregnancy test 10/09/16  SpO2 95%   BMI 36.58 kg/m   Physical Exam  Constitutional: She is oriented to person, place, and time. She appears well-developed and well-nourished.  HENT:  Head: Normocephalic.  Eyes: Conjunctivae are normal.  Cardiovascular: Normal rate.   Pulmonary/Chest: Effort normal.  Abdominal: She exhibits no distension.  Pt has tenderness over right side of the abdomen and suprapubic abdomen medially. Tenderness is worst over RLQ. No rebounding or guarding.    Musculoskeletal: Normal range of motion.  Neurological: She is alert and oriented to person, place, and time.  Skin: Skin is warm and dry.  Psychiatric: She has a normal mood and affect.  Nursing note and vitals reviewed.    ED Treatments / Results  DIAGNOSTIC STUDIES:  Oxygen Saturation is 100% on RA, normal by my interpretation.    COORDINATION OF CARE:  3:22 AM Discussed treatment plan with pt at bedside including CT, pain medication, and potential pelvic exam and pt agreed to plan.   Labs (all labs ordered are listed, but only abnormal results are displayed) Labs Reviewed  COMPREHENSIVE METABOLIC PANEL - Abnormal; Notable for the following:       Result Value   Glucose, Bld 148 (*)    ALT 12 (*)    All other components within normal limits  CBC - Abnormal; Notable for the following:    WBC 16.5 (*)    Platelets 505 (*)    All other components within normal limits  URINALYSIS, ROUTINE W REFLEX MICROSCOPIC - Abnormal; Notable for the following:    APPearance HAZY (*)    Glucose, UA 50 (*)    Hgb urine dipstick LARGE (*)    Protein, ur 30 (*)    Leukocytes, UA TRACE (*)    Bacteria, UA RARE (*)    Squamous Epithelial / LPF 6-30 (*)    All other components within normal limits    LIPASE, BLOOD  POC URINE PREG, ED  I-STAT CG4 LACTIC ACID, ED    EKG  EKG Interpretation None       Radiology No results found.  Procedures Procedures (including critical care time)  Medications Ordered in ED Medications  sodium chloride 0.9 % bolus 1,000 mL (0 mLs Intravenous Stopped 10/09/16 1206)  morphine 4 MG/ML injection 8 mg (8 mg Intravenous Given 10/09/16 0359)  iopamidol (ISOVUE-300) 61 % injection 100 mL (100 mLs Intravenous Contrast Given 10/09/16 0402)  gadobenate dimeglumine (MULTIHANCE) injection 20 mL (19 mLs Intravenous Contrast Given 10/09/16 0842)  morphine 4 MG/ML injection 8 mg (8 mg Intravenous Given 10/09/16 0809)     Initial Impression / Assessment and Plan / ED Course  I have reviewed  the triage vital signs and the nursing notes.  Pertinent labs & imaging results that were available during my care of the patient were reviewed by me and considered in my medical decision making (see chart for details).     Pt with lower quadrant abd pain, CT ordered - raised suspicion for pathology - so [place] ordered. [place] recommended MRI - so MRi completed. Pt has severe R sided tenderness. Imminent surgical pathology or vascular emergency to be ruled out. Anticipate d/c with gyne fu Final Clinical Impressions(s) / ED Diagnoses   Final diagnoses:  RLQ abdominal pain  Pelvic pain in female  Uterine leiomyoma, unspecified location    New Prescriptions Discharge Medication List as of 10/09/2016 11:55 AM    START taking these medications   Details  HYDROcodone-acetaminophen (NORCO/VICODIN) 5-325 MG tablet Take 1-2 tablets by mouth every 4 (four) hours as needed., Starting Tue 10/09/2016, Print       I personally performed the services described in this documentation, which was scribed in my presence. The recorded information has been reviewed and is accurate.    Varney Biles, MD 10/18/16 Springer, MD 10/19/16 [id number]    Varney Biles,  MD 10/19/16 2258

## 2016-10-09 NOTE — ED Notes (Signed)
Bed: WA22 Expected date:  Expected time:  Means of arrival:  Comments: 

## 2016-10-09 NOTE — ED Provider Notes (Signed)
  Physical Exam  BP 122/81 (BP Location: Right Arm)   Pulse 96   Temp 98.7 F (37.1 C) (Oral)   Resp 18   Ht 5\' 2"  (1.575 m)   Wt 200 lb (90.7 kg)   LMP 07/10/2016 Comment: negative urine pregnancy test 10/09/16  SpO2 97%   BMI 36.58 kg/m   Physical Exam  ED Course  Procedures  MDM Received patient in signout. Has MRI that shows either hydrometra or degenerating fibroid. Discussed with Dr. Nehemiah Settle at [facility]. Has not had fevers but white count is elevated. Will get pain medicines will follow-up in the clinic. Infection felt less likely but instructions given to patient. Discharge home.        Davonna Belling, MD 10/09/16 1158

## 2016-10-09 NOTE — ED Notes (Signed)
Patient transported to CT 

## 2017-05-23 DIAGNOSIS — B999 Unspecified infectious disease: Secondary | ICD-10-CM

## 2017-05-23 HISTORY — DX: Unspecified infectious disease: B99.9

## 2017-06-16 ENCOUNTER — Emergency Department (HOSPITAL_COMMUNITY)
Admission: EM | Admit: 2017-06-16 | Discharge: 2017-06-16 | Disposition: A | Discharge status: Home / Self Care | Payer: Commercial Managed Care - PPO | Source: Home / Self Care | Attending: Emergency Medicine | Admitting: Emergency Medicine

## 2017-06-16 ENCOUNTER — Encounter (HOSPITAL_COMMUNITY): Payer: Self-pay | Admitting: Emergency Medicine

## 2017-06-16 ENCOUNTER — Emergency Department (HOSPITAL_COMMUNITY): Payer: Commercial Managed Care - PPO

## 2017-06-16 DIAGNOSIS — F17210 Nicotine dependence, cigarettes, uncomplicated: Secondary | ICD-10-CM | POA: Insufficient documentation

## 2017-06-16 DIAGNOSIS — R102 Pelvic and perineal pain: Secondary | ICD-10-CM | POA: Insufficient documentation

## 2017-06-16 DIAGNOSIS — D259 Leiomyoma of uterus, unspecified: Secondary | ICD-10-CM | POA: Diagnosis not present

## 2017-06-16 DIAGNOSIS — N857 Hematometra: Secondary | ICD-10-CM | POA: Diagnosis not present

## 2017-06-16 LAB — COMPREHENSIVE METABOLIC PANEL
ALK PHOS: 59 U/L (ref 38–126)
ALT: 10 U/L — AB (ref 14–54)
ANION GAP: 7 (ref 5–15)
AST: 16 U/L (ref 15–41)
Albumin: 3.8 g/dL (ref 3.5–5.0)
BILIRUBIN TOTAL: 0.5 mg/dL (ref 0.3–1.2)
BUN: 8 mg/dL (ref 6–20)
CALCIUM: 8.8 mg/dL — AB (ref 8.9–10.3)
CO2: 25 mmol/L (ref 22–32)
CREATININE: 0.56 mg/dL (ref 0.44–1.00)
Chloride: 106 mmol/L (ref 101–111)
GFR calc non Af Amer: >60 mL/min (ref >60)
GLUCOSE: 143 mg/dL — AB (ref 65–99)
Potassium: 3.5 mmol/L (ref 3.5–5.1)
Sodium: 138 mmol/L (ref 135–145)
TOTAL PROTEIN: 7.7 g/dL (ref 6.5–8.1)

## 2017-06-16 LAB — URINALYSIS, ROUTINE W REFLEX MICROSCOPIC
BILIRUBIN URINE: NEGATIVE
Glucose, UA: NEGATIVE mg/dL
KETONES UR: 5 mg/dL — AB
LEUKOCYTES UA: NEGATIVE
Nitrite: NEGATIVE
Protein, ur: 30 mg/dL — AB
Specific Gravity, Urine: 1.021 (ref 1.005–1.030)
pH: 6 (ref 5.0–8.0)

## 2017-06-16 LAB — CBC
HCT: 37 % (ref 36.0–46.0)
HEMOGLOBIN: 11.9 g/dL — AB (ref 12.0–15.0)
MCH: 28.7 pg (ref 26.0–34.0)
MCHC: 32.2 g/dL (ref 30.0–36.0)
MCV: 89.2 fL (ref 78.0–100.0)
Platelets: 434 10*3/uL — ABNORMAL HIGH (ref 150–400)
RBC: 4.15 MIL/uL (ref 3.87–5.11)
RDW: 14.7 % (ref 11.5–15.5)
WBC: 14.4 10*3/uL — ABNORMAL HIGH (ref 4.0–10.5)

## 2017-06-16 LAB — I-STAT BETA HCG BLOOD, ED (MC, WL, AP ONLY)

## 2017-06-16 LAB — WET PREP, GENITAL
SPERM: NONE SEEN
TRICH WET PREP: NONE SEEN
Yeast Wet Prep HPF POC: NONE SEEN

## 2017-06-16 LAB — LIPASE, BLOOD: Lipase: 19 U/L (ref 11–51)

## 2017-06-16 MED ORDER — HYDROMORPHONE HCL 1 MG/ML IJ SOLN
0.5000 mg | Freq: Once | INTRAMUSCULAR | Status: AC
  Administered 2017-06-16: 0.5 mg via INTRAVENOUS
  Filled 2017-06-16: qty 1

## 2017-06-16 MED ORDER — KETOROLAC TROMETHAMINE 15 MG/ML IJ SOLN
15.0000 mg | Freq: Once | INTRAMUSCULAR | Status: AC
  Administered 2017-06-16: 15 mg via INTRAVENOUS
  Filled 2017-06-16: qty 1

## 2017-06-16 MED ORDER — OXYCODONE-ACETAMINOPHEN 5-325 MG PO TABS
1.0000 | ORAL_TABLET | Freq: Once | ORAL | Status: AC
  Administered 2017-06-16: 1 via ORAL
  Filled 2017-06-16: qty 1

## 2017-06-16 MED ORDER — MELOXICAM 7.5 MG PO TABS: 7.5000 mg | ORAL_TABLET | Freq: Two times a day (BID) | ORAL | 0 refills | Status: DC

## 2017-06-16 NOTE — ED Triage Notes (Signed)
Patient reports being seen Wednesday at her gynecologist to have biopsy due to fibroids and has been having pelvic pain along with pain in lower right flank and hips. Pt also c/o nausea and dysuria. Pain worse when having a BM. Pt is scheduled for hysterectomy in Dec.

## 2017-06-16 NOTE — Discharge Instructions (Signed)

## 2017-06-16 NOTE — ED Provider Notes (Signed)
[facility] DEPT Provider Note  CSN: [id number] Arrival date & time: 06/16/17 1024  Chief Complaint(s) Pelvic Pain and Nausea  HPI Christie [name] is a 46 y.o. female with a history of uterine fibroids who presents to the emergency department with progressively worsening pelvic pain following a fibroid biopsy that was performed 5 days ago by her gynecologist.  She reports that immediately following the biopsy patient began having pain which improved with Motrin initially however returned and has been persistent.  She has been taking Motrin, Tylenol which have not provided any pain relief.  Patient had prescription for oxycodone which she is been taking.  She reports that the oxycodone does provide a short term improvement.  She is endorsing some nausea but no vomiting.  No diarrhea.  No urinary symptoms.  No vaginal discharge.  Patient is endorsing vaginal bleeding following the biopsy.  Denies any fevers or chills.  No other associated symptoms.  No other alleviating or aggravating factors.  HPI  Past Medical History Past Medical History:  Diagnosis Date  . Anemia   . Heart murmur    flow murmur  . Uterine fibroid   . Wears glasses    Patient Active Problem List   Diagnosis Date Noted  . Menorrhagia 04/06/2015    Class: Present on Admission  . Uterine fibroid 04/06/2015    Class: Present on Admission  . Murmur 03/04/2014  . HTN (hypertension) 03/04/2014   Home Medication(s) Prior to Admission medications   Medication Sig Start Date End Date Taking? Authorizing Provider  acetaminophen (TYLENOL) 500 MG tablet Take 1,000 mg by mouth every 6 (six) hours as needed for mild pain.    [provider]  HYDROcodone-acetaminophen (NORCO/VICODIN) 5-325 MG tablet Take 1-2 tablets by mouth every 4 (four) hours as needed. 10/09/16   Davonna Belling, MD  ibuprofen (ADVIL,MOTRIN) 800 MG tablet Take 1 tablet (800 mg total) by mouth every 8 (eight) hours as  needed. 10/09/16   Davonna Belling, MD  meloxicam (MOBIC) 7.5 MG tablet Take 1 tablet (7.5 mg total) by mouth 2 (two) times daily for 5 days. 06/16/17 06/21/17  Fatima Blank, MD  oxyCODONE-acetaminophen (PERCOCET) 7.5-325 MG per tablet Take 1 tablet by mouth every 4 (four) hours as needed for severe pain. Patient not taking: Reported on 10/09/2016 04/06/15   Arvella Nigh, MD  vitamin E 100 UNIT capsule Take 100 Units by mouth daily.    [provider]                                                                                                                                    Past Surgical History Past Surgical History:  Procedure Laterality Date  . CESAREAN SECTION  x3  last one 1997   Bilateral Tubal Ligation w/ last one  . DILITATION & CURRETTAGE/HYSTROSCOPY WITH HYDROTHERMAL ABLATION N/A 04/06/2015   Procedure: DILATATION & CURETTAGE/HYSTEROSCOPY WITH HYDROTHERMAL ABLATION,  [place];  Surgeon: Arvella Nigh, MD;  Location: [facility];  Service: Gynecology;  Laterality: N/A;  . TRANSTHORACIC ECHOCARDIOGRAM  03-05-2014   mild LVH,  grade I diastolic dysfunction,  ef 65-70%/  trivial PR   Family History Family History  Problem Relation Age of Onset  . Heart disease Mother        Pacemaker  . Hypertension Mother     Social History Social History   Tobacco Use  . Smoking status: Current Every Day Smoker    Packs/day: 0.50    Years: 25.00    Pack years: 12.50    Types: Cigarettes  . Smokeless tobacco: Never Used  Substance Use Topics  . Alcohol use: Yes    Comment: occasional  . Drug use: No   Allergies Oxycodone  Review of Systems Review of Systems All other systems are reviewed and are negative for acute change except as noted in the HPI  Physical Exam Vital Signs  I have reviewed the triage vital signs BP (!) 163/89 (BP Location: Right Arm)   Pulse 97   Temp 97.9 F (36.6 C) (Oral)   Resp 18   SpO2 100%   Physical  Exam  Constitutional: She is oriented to person, place, and time. She appears well-developed and well-nourished. No distress.  HENT:  Head: Normocephalic and atraumatic.  Nose: Nose normal.  Eyes: Conjunctivae and EOM are normal. Pupils are equal, round, and reactive to light. Right eye exhibits no discharge. Left eye exhibits no discharge. No scleral icterus.  Neck: Normal range of motion. Neck supple.  Cardiovascular: Normal rate and regular rhythm. Exam reveals no gallop and no friction rub.  No murmur heard. Pulmonary/Chest: Effort normal and breath sounds normal. No stridor. No respiratory distress. She has no rales.  Abdominal: Soft. She exhibits no distension. There is tenderness in the suprapubic area. There is guarding. There is no rigidity, no rebound, no tenderness at McBurney's point and negative Murphy's sign. No hernia.  Genitourinary: Pelvic exam was performed with patient supine. Uterus is enlarged and tender. Cervix exhibits no motion tenderness, no discharge and no friability. Right adnexum displays no mass and no tenderness. Left adnexum displays no mass and no tenderness. No vaginal discharge found.  Musculoskeletal: She exhibits no edema or tenderness.  Neurological: She is alert and oriented to person, place, and time.  Skin: Skin is warm and dry. No rash noted. She is not diaphoretic. No erythema.  Psychiatric: She has a normal mood and affect.  Vitals reviewed.   ED Results and Treatments Labs (all labs ordered are listed, but only abnormal results are displayed) Labs Reviewed  WET PREP, GENITAL - Abnormal; Notable for the following components:      Result Value   Clue Cells Wet Prep HPF POC PRESENT (*)    WBC, Wet Prep HPF POC FEW (*)    All other components within normal limits  COMPREHENSIVE METABOLIC PANEL - Abnormal; Notable for the following components:   Glucose, Bld 143 (*)    Calcium 8.8 (*)    ALT 10 (*)    All other components within normal limits    CBC - Abnormal; Notable for the following components:   WBC 14.4 (*)    Hemoglobin 11.9 (*)    Platelets 434 (*)    All other components within normal limits  URINALYSIS, ROUTINE W REFLEX MICROSCOPIC - Abnormal; Notable for the following components:   Hgb urine dipstick LARGE (*)    Ketones,  ur 5 (*)    Protein, ur 30 (*)    Bacteria, UA RARE (*)    Squamous Epithelial / LPF 0-5 (*)    All other components within normal limits  LIPASE, BLOOD  I-STAT BETA HCG BLOOD, ED ([facility], [facility], [facility] ONLY)  GC/CHLAMYDIA PROBE AMP ([place]) NOT AT [facility]                                                                                                                         EKG  EKG Interpretation  Date/Time:    Ventricular Rate:    PR Interval:    QRS Duration:   QT Interval:    QTC Calculation:   R Axis:     Text Interpretation:        Radiology US Transvaginal Non-ob  Result Date: 06/16/2017 CLINICAL DATA:  Patient with pelvic and right lower quadrant pain for 3 days. EXAM: TRANSABDOMINAL AND TRANSVAGINAL ULTRASOUND OF PELVIS DOPPLER ULTRASOUND OF OVARIES TECHNIQUE: Both transabdominal and transvaginal ultrasound examinations of the pelvis were performed. Transabdominal technique was performed for global imaging of the pelvis including uterus, ovaries, adnexal regions, and pelvic cul-de-sac. It was necessary to proceed with endovaginal exam following the transabdominal exam to visualize the endometrium and adnexal structures. Color and duplex Doppler ultrasound was utilized to evaluate blood flow to the ovaries. COMPARISON:  Pelvic MRI 10/09/2016 FINDINGS: Uterus Measurements: 11.9 x 7.9 x 8.4 cm. Multiple uterine fibroids are demonstrated. There is a 6.1 x 6.7 x 7.3 cm partially exophytic fibroid off the uterine fundus, a 2.6 x 2.6 x 2.3 cm fibroid within the anterior uterine body and a 2.0 x 2.2 x 1.9 cm fibroid within the posterior uterine body. Additional fibroids are demonstrated. Endometrium  Thickness: 10 mm. There is mixed echogenicity material within the endometrial canal. Right ovary Measurements: 2.8 x 1.7 x 2.0 cm. Normal appearance/no adnexal mass. Left ovary Measurements: 4.2 x 3 1 x 3.5 cm. There is a 3.1 x 2.5 x 2.0 cm cyst within the left ovary. Pulsed Doppler evaluation of both ovaries demonstrates normal low-resistance arterial and venous waveforms. Other findings No abnormal free fluid. IMPRESSION: 1. The endometrial canal is expanded and contains mixed echogenicity material which may represent complex fluid or blood products. Recommend clinical correlation to exclude the possibility of a cervical mass as obstructing lesion. 2. Arterial and venous flow demonstrated to the right and left ovaries. No sonographic evidence to suggest torsion. 3. Enlarged fibroid uterus. Electronically Signed   By: Lovey Newcomer M.D.   On: 06/16/2017 14:55   US Pelvis Complete  Result Date: 06/16/2017 CLINICAL DATA:  Patient with pelvic and right lower quadrant pain for 3 days. EXAM: TRANSABDOMINAL AND TRANSVAGINAL ULTRASOUND OF PELVIS DOPPLER ULTRASOUND OF OVARIES TECHNIQUE: Both transabdominal and transvaginal ultrasound examinations of the pelvis were performed. Transabdominal technique was performed for global imaging of the pelvis including uterus, ovaries, adnexal regions, and pelvic cul-de-sac. It was necessary to proceed with endovaginal exam following the  transabdominal exam to visualize the endometrium and adnexal structures. Color and duplex Doppler ultrasound was utilized to evaluate blood flow to the ovaries. COMPARISON:  Pelvic MRI 10/09/2016 FINDINGS: Uterus Measurements: 11.9 x 7.9 x 8.4 cm. Multiple uterine fibroids are demonstrated. There is a 6.1 x 6.7 x 7.3 cm partially exophytic fibroid off the uterine fundus, a 2.6 x 2.6 x 2.3 cm fibroid within the anterior uterine body and a 2.0 x 2.2 x 1.9 cm fibroid within the posterior uterine body. Additional fibroids are demonstrated. Endometrium  Thickness: 10 mm. There is mixed echogenicity material within the endometrial canal. Right ovary Measurements: 2.8 x 1.7 x 2.0 cm. Normal appearance/no adnexal mass. Left ovary Measurements: 4.2 x 3 1 x 3.5 cm. There is a 3.1 x 2.5 x 2.0 cm cyst within the left ovary. Pulsed Doppler evaluation of both ovaries demonstrates normal low-resistance arterial and venous waveforms. Other findings No abnormal free fluid. IMPRESSION: 1. The endometrial canal is expanded and contains mixed echogenicity material which may represent complex fluid or blood products. Recommend clinical correlation to exclude the possibility of a cervical mass as obstructing lesion. 2. Arterial and venous flow demonstrated to the right and left ovaries. No sonographic evidence to suggest torsion. 3. Enlarged fibroid uterus. Electronically Signed   By: Lovey Newcomer M.D.   On: 06/16/2017 14:55   [place] Art/ven Flow Abd Pelv Doppler  Result Date: 06/16/2017 CLINICAL DATA:  Patient with pelvic and right lower quadrant pain for 3 days. EXAM: TRANSABDOMINAL AND TRANSVAGINAL ULTRASOUND OF PELVIS DOPPLER ULTRASOUND OF OVARIES TECHNIQUE: Both transabdominal and transvaginal ultrasound examinations of the pelvis were performed. Transabdominal technique was performed for global imaging of the pelvis including uterus, ovaries, adnexal regions, and pelvic cul-de-sac. It was necessary to proceed with endovaginal exam following the transabdominal exam to visualize the endometrium and adnexal structures. Color and duplex Doppler ultrasound was utilized to evaluate blood flow to the ovaries. COMPARISON:  Pelvic MRI 10/09/2016 FINDINGS: Uterus Measurements: 11.9 x 7.9 x 8.4 cm. Multiple uterine fibroids are demonstrated. There is a 6.1 x 6.7 x 7.3 cm partially exophytic fibroid off the uterine fundus, a 2.6 x 2.6 x 2.3 cm fibroid within the anterior uterine body and a 2.0 x 2.2 x 1.9 cm fibroid within the posterior uterine body. Additional fibroids are demonstrated.  Endometrium Thickness: 10 mm. There is mixed echogenicity material within the endometrial canal. Right ovary Measurements: 2.8 x 1.7 x 2.0 cm. Normal appearance/no adnexal mass. Left ovary Measurements: 4.2 x 3 1 x 3.5 cm. There is a 3.1 x 2.5 x 2.0 cm cyst within the left ovary. Pulsed Doppler evaluation of both ovaries demonstrates normal low-resistance arterial and venous waveforms. Other findings No abnormal free fluid. IMPRESSION: 1. The endometrial canal is expanded and contains mixed echogenicity material which may represent complex fluid or blood products. Recommend clinical correlation to exclude the possibility of a cervical mass as obstructing lesion. 2. Arterial and venous flow demonstrated to the right and left ovaries. No sonographic evidence to suggest torsion. 3. Enlarged fibroid uterus. Electronically Signed   By: Lovey Newcomer M.D.   On: 06/16/2017 14:55   Pertinent labs & imaging results that were available during my care of the patient were reviewed by me and considered in my medical decision making (see chart for details).  Medications Ordered in ED Medications  ketorolac (TORADOL) 15 MG/ML injection 15 mg (15 mg Intravenous Given 06/16/17 1235)  HYDROmorphone (DILAUDID) injection 0.5 mg (0.5 mg Intravenous Given 06/16/17 1235)  oxyCODONE-acetaminophen (  PERCOCET/ROXICET) 5-325 MG per tablet 1 tablet (1 tablet Oral Given 06/16/17 1548)                                                                                                                                    Procedures Procedures  (including critical care time)  Medical Decision Making / ED Course I have reviewed the nursing notes for this encounter and the patient's prior records (if available in EHR or on provided paperwork).    Pelvic pain following uterine fibroid biopsy with associated vaginal bleeding.  Exam with lower abdominal tenderness to palpation.  Pelvic exam with vaginal bleeding, from the office.  No  discharge.  Cervix appears normal.  Patient does have large uterus mass likely her fibroid that is tender to palpation.  Labs revealed leukocytosis likely secondary to demargination from pain, but appears to be similar to her prior CBCs which may also be her baseline.  Hemoglobin close to her baseline.  Pelvic ultrasound without evidence of torsion or TOA.  No suspicion for cervicitis or PID.  Low suspicion for serious intra-abdominal inflammatory/infectious process.   Patient treated with IV and oral pain medicine.  Able to tolerate p.o. intake.  Recommended close follow-up with GYN.  The patient appears reasonably screened and/or stabilized for discharge and I doubt any other medical condition or other [facility] requiring further screening, evaluation, or treatment in the ED at this time prior to discharge.  The patient is safe for discharge with strict return precautions.   Final Clinical Impression(s) / ED Diagnoses Final diagnoses:  Pelvic pain in female    Disposition: Discharge  Condition: Good  I have discussed the results, Dx and Tx plan with the patient who expressed understanding and agree(s) with the plan. Discharge instructions discussed at great length. The patient was given strict return precautions who verbalized understanding of the instructions. No further questions at time of discharge.    ED Discharge Orders        Ordered    meloxicam (MOBIC) 7.5 MG tablet  2 times daily     06/16/17 1626       Follow Up: Gynecology  Schedule an appointment as soon as possible for a visit  in 3-5 days, If symptoms do not improve or  worsen    This chart was dictated using voice recognition software.  Despite best efforts to proofread,  errors can occur which can change the documentation meaning.   Fatima Blank, MD 06/16/17 [phone]

## 2017-06-17 ENCOUNTER — Inpatient Hospital Stay (HOSPITAL_COMMUNITY)
Admission: AD | Admit: 2017-06-17 | Discharge: 2017-06-20 | DRG: 761 | Disposition: A | Discharge status: Home / Self Care | Payer: Commercial Managed Care - PPO | Source: Ambulatory Visit | Attending: Obstetrics and Gynecology | Admitting: Obstetrics and Gynecology

## 2017-06-17 ENCOUNTER — Other Ambulatory Visit: Payer: Self-pay

## 2017-06-17 ENCOUNTER — Encounter (HOSPITAL_COMMUNITY): Payer: Self-pay | Admitting: Certified Registered Nurse Anesthetist

## 2017-06-17 DIAGNOSIS — N857 Hematometra: Principal | ICD-10-CM

## 2017-06-17 DIAGNOSIS — D259 Leiomyoma of uterus, unspecified: Secondary | ICD-10-CM | POA: Diagnosis present

## 2017-06-17 DIAGNOSIS — R102 Pelvic and perineal pain: Secondary | ICD-10-CM | POA: Diagnosis present

## 2017-06-17 LAB — BASIC METABOLIC PANEL
Anion gap: 9 (ref 5–15)
BUN: 10 mg/dL (ref 6–20)
CO2: 25 mmol/L (ref 22–32)
Calcium: 9 mg/dL (ref 8.9–10.3)
Chloride: 105 mmol/L (ref 101–111)
Creatinine, Ser: 0.67 mg/dL (ref 0.44–1.00)
GFR calc Af Amer: >60 mL/min (ref >60)
Glucose, Bld: 114 mg/dL — ABNORMAL HIGH (ref 65–99)
POTASSIUM: 3.4 mmol/L — AB (ref 3.5–5.1)
Sodium: 139 mmol/L (ref 135–145)

## 2017-06-17 LAB — CBC
HEMATOCRIT: 36.8 % (ref 36.0–46.0)
Hemoglobin: 11.5 g/dL — ABNORMAL LOW (ref 12.0–15.0)
MCH: 28.3 pg (ref 26.0–34.0)
MCHC: 31.3 g/dL (ref 30.0–36.0)
MCV: 90.4 fL (ref 78.0–100.0)
Platelets: 383 10*3/uL (ref 150–400)
RBC: 4.07 MIL/uL (ref 3.87–5.11)
RDW: 14.8 % (ref 11.5–15.5)
WBC: 14.4 10*3/uL — ABNORMAL HIGH (ref 4.0–10.5)

## 2017-06-17 LAB — GC/CHLAMYDIA PROBE AMP (~~LOC~~) NOT AT ARMC
CHLAMYDIA, DNA PROBE: NEGATIVE
Neisseria Gonorrhea: NEGATIVE

## 2017-06-17 MED ORDER — DOXYCYCLINE HYCLATE 100 MG IV SOLR
100.0000 mg | Freq: Two times a day (BID) | INTRAVENOUS | Status: DC
  Administered 2017-06-17 – 2017-06-20 (×6): 100 mg via INTRAVENOUS
  Filled 2017-06-17 (×6): qty 100

## 2017-06-17 MED ORDER — ONDANSETRON HCL 4 MG PO TABS: 4.0000 mg | ORAL_TABLET | Freq: Four times a day (QID) | ORAL | Status: DC | PRN

## 2017-06-17 MED ORDER — KETOROLAC TROMETHAMINE 30 MG/ML IJ SOLN
30.0000 mg | Freq: Once | INTRAMUSCULAR | Status: AC
  Administered 2017-06-17: 30 mg via INTRAVENOUS
  Filled 2017-06-17: qty 1

## 2017-06-17 MED ORDER — HYDROMORPHONE 1 MG/ML IV SOLN
INTRAVENOUS | Status: DC
  Administered 2017-06-17 (×2): 1.2 mg via INTRAVENOUS
  Administered 2017-06-17: 18:00:00 via INTRAVENOUS
  Administered 2017-06-18: 11 mg via INTRAVENOUS
  Administered 2017-06-18: 2.4 mg via INTRAVENOUS
  Administered 2017-06-18: 2 mg via INTRAVENOUS
  Administered 2017-06-18: 2 mL via INTRAVENOUS
  Administered 2017-06-18: 0.6 mL via INTRAVENOUS
  Administered 2017-06-18: 3.8 mg via INTRAVENOUS
  Administered 2017-06-19: 6 mg via INTRAVENOUS
  Administered 2017-06-19: 5 mg via INTRAVENOUS
  Administered 2017-06-19: 0.4 mg via INTRAVENOUS
  Administered 2017-06-19: 6 mg via INTRAVENOUS
  Filled 2017-06-17: qty 25

## 2017-06-17 MED ORDER — CLINDAMYCIN PHOSPHATE 900 MG/50ML IV SOLN
900.0000 mg | Freq: Three times a day (TID) | INTRAVENOUS | Status: DC
  Filled 2017-06-17 (×2): qty 50

## 2017-06-17 MED ORDER — DIPHENHYDRAMINE HCL 50 MG/ML IJ SOLN: 12.5000 mg | Freq: Four times a day (QID) | INTRAMUSCULAR | Status: DC | PRN

## 2017-06-17 MED ORDER — LACTATED RINGERS IV SOLN
INTRAVENOUS | Status: DC
  Administered 2017-06-17 – 2017-06-19 (×3): via INTRAVENOUS

## 2017-06-17 MED ORDER — DIPHENHYDRAMINE HCL 12.5 MG/5ML PO ELIX
12.5000 mg | ORAL_SOLUTION | Freq: Four times a day (QID) | ORAL | Status: DC | PRN
  Administered 2017-06-19: 12.5 mg via ORAL
  Filled 2017-06-17: qty 5

## 2017-06-17 MED ORDER — HYDROMORPHONE HCL 1 MG/ML IJ SOLN
0.5000 mg | Freq: Once | INTRAMUSCULAR | Status: AC
  Administered 2017-06-17: 0.5 mg via INTRAVENOUS
  Filled 2017-06-17: qty 0.5

## 2017-06-17 MED ORDER — NALOXONE HCL 0.4 MG/ML IJ SOLN: 0.4000 mg | INTRAMUSCULAR | Status: DC | PRN

## 2017-06-17 MED ORDER — PRENATAL MULTIVITAMIN CH
1.0000 | ORAL_TABLET | Freq: Every day | ORAL | Status: DC
  Administered 2017-06-18 – 2017-06-19 (×2): 1 via ORAL
  Filled 2017-06-17 (×2): qty 1

## 2017-06-17 MED ORDER — BOOST / RESOURCE BREEZE PO LIQD
1.0000 | Freq: Three times a day (TID) | ORAL | Status: DC
  Administered 2017-06-17 – 2017-06-18 (×4): 1 via ORAL
  Administered 2017-06-19: 237 mL via ORAL
  Administered 2017-06-19 – 2017-06-20 (×2): 1 via ORAL
  Filled 2017-06-17 (×9): qty 1

## 2017-06-17 MED ORDER — ONDANSETRON HCL 4 MG/2ML IJ SOLN: 4.0000 mg | Freq: Four times a day (QID) | INTRAMUSCULAR | Status: DC | PRN

## 2017-06-17 MED ORDER — SODIUM CHLORIDE 0.9% FLUSH
9.0000 mL | INTRAVENOUS | Status: DC | PRN
  Administered 2017-06-19: 3 mL via INTRAVENOUS
  Filled 2017-06-17: qty 9

## 2017-06-17 MED ORDER — SODIUM CHLORIDE 0.9 % IV SOLN
3.0000 g | Freq: Four times a day (QID) | INTRAVENOUS | Status: DC
  Administered 2017-06-17 – 2017-06-20 (×11): 3 g via INTRAVENOUS
  Filled 2017-06-17 (×12): qty 3

## 2017-06-17 MED ORDER — ACETAMINOPHEN 325 MG PO TABS
325.0000 mg | ORAL_TABLET | Freq: Four times a day (QID) | ORAL | Status: DC | PRN
  Administered 2017-06-17 – 2017-06-18 (×4): 650 mg via ORAL
  Filled 2017-06-17 (×4): qty 2

## 2017-06-17 MED ORDER — IBUPROFEN 600 MG PO TABS
600.0000 mg | ORAL_TABLET | Freq: Four times a day (QID) | ORAL | Status: DC | PRN
  Administered 2017-06-17 – 2017-06-19 (×4): 600 mg via ORAL
  Filled 2017-06-17 (×4): qty 1

## 2017-06-17 MED ORDER — OXYCODONE-ACETAMINOPHEN 5-325 MG PO TABS
1.0000 | ORAL_TABLET | ORAL | Status: DC | PRN
  Administered 2017-06-19: 1 via ORAL
  Administered 2017-06-19: 2 via ORAL
  Filled 2017-06-17: qty 1
  Filled 2017-06-17: qty 2

## 2017-06-17 NOTE — Plan of Care (Signed)
Patient given information regarding her care and treatment. Given opportunity to make choices as able. Family member at bedside. Patient reminded to call for help to get Oob due to having the PCA pump and possibility of feeling weak when OOB.

## 2017-06-17 NOTE — H&P (Signed)
Christie [name] is an 46 y.o. female. Admitted with increasing pain.  History of fibroids. Endometrial sampling last week.  Seen in ER yesterday.  Sonogram show no torsion. Probable endometritis.  Pertinent Gynecological History: Menses: flow is moderate Bleeding: dysfunctional uterine bleeding Contraception: tubal ligation DES exposure: denies Blood transfusions: none Sexually transmitted diseases: no past history Previous GYN Procedures: ablation  Last mammogram: normal Date: 2017 Last pap: normal Date: 2018 OB History: G3, P3   Menstrual History: Menarche age: 69 No LMP recorded.    Past Medical History:  Diagnosis Date  . Anemia   . Heart murmur    flow murmur  . Uterine fibroid   . Wears glasses     Past Surgical History:  Procedure Laterality Date  . CESAREAN SECTION  x3  last one 1997   Bilateral Tubal Ligation w/ last one  . DILITATION & CURRETTAGE/HYSTROSCOPY WITH HYDROTHERMAL ABLATION N/A 04/06/2015   Procedure: DILATATION & CURETTAGE/HYSTEROSCOPY WITH HYDROTHERMAL ABLATION, MYOSURE AND NOVASURE;  Surgeon: Arvella Nigh, MD;  Location: [facility];  Service: Gynecology;  Laterality: N/A;  . TRANSTHORACIC ECHOCARDIOGRAM  03-05-2014   mild LVH,  grade I diastolic dysfunction,  ef 65-70%/  trivial PR    Family History  Problem Relation Age of Onset  . Heart disease Mother        Pacemaker  . Hypertension Mother     Social History:  reports that she has been smoking cigarettes.  She has a 12.50 pack-year smoking history. she has never used smokeless tobacco. She reports that she drinks alcohol. She reports that she does not use drugs.  Allergies:  Allergies  Allergen Reactions  . Oxycodone Itching    Can take with benadryl if needed    Medications Prior to Admission  Medication Sig Dispense Refill Last Dose  . acetaminophen (TYLENOL) 500 MG tablet Take 1,000 mg by mouth every 6 (six) hours as needed for mild pain.   Past Week at Unknown time   . HYDROcodone-acetaminophen (NORCO/VICODIN) 5-325 MG tablet Take 1-2 tablets by mouth every 4 (four) hours as needed. 8 tablet 0   . ibuprofen (ADVIL,MOTRIN) 800 MG tablet Take 1 tablet (800 mg total) by mouth every 8 (eight) hours as needed. 8 tablet 0   . meloxicam (MOBIC) 7.5 MG tablet Take 1 tablet (7.5 mg total) by mouth 2 (two) times daily for 5 days. 10 tablet 0   . oxyCODONE-acetaminophen (PERCOCET) 7.5-325 MG per tablet Take 1 tablet by mouth every 4 (four) hours as needed for severe pain. (Patient not taking: Reported on 10/09/2016) 30 tablet 0 Not Taking at Unknown time  . vitamin E 100 UNIT capsule Take 100 Units by mouth daily.   Past Week at Unknown time    Review of Systems  Constitutional: Negative.   HENT: Negative.   Eyes: Negative.   Respiratory: Negative.   Cardiovascular: Negative.   Gastrointestinal: Negative.   Genitourinary: Negative.   Musculoskeletal: Negative.   Skin: Negative.   Neurological: Negative.   Endo/Heme/Allergies: Negative.   Psychiatric/Behavioral: Negative.     There were no vitals taken for this visit. Physical Exam  Constitutional: She is oriented to person, place, and time. She appears well-developed and well-nourished.  Cardiovascular: Normal rate, regular rhythm and normal heart sounds.  Respiratory: Effort normal and breath sounds normal.  GI:  Uterine fibroids to umbilicus very tender positive bs.   Genitourinary: Vagina normal.  Genitourinary Comments: Fibroid uterus  Neurological: She is alert and oriented to person,  place, and time.    No results found for this or any previous visit (from the past 24 hour(s)).  US Transvaginal Non-ob  Result Date: 06/16/2017 CLINICAL DATA:  Patient with pelvic and right lower quadrant pain for 3 days. EXAM: TRANSABDOMINAL AND TRANSVAGINAL ULTRASOUND OF PELVIS DOPPLER ULTRASOUND OF OVARIES TECHNIQUE: Both transabdominal and transvaginal ultrasound examinations of the pelvis were performed.  Transabdominal technique was performed for global imaging of the pelvis including uterus, ovaries, adnexal regions, and pelvic cul-de-sac. It was necessary to proceed with endovaginal exam following the transabdominal exam to visualize the endometrium and adnexal structures. Color and duplex Doppler ultrasound was utilized to evaluate blood flow to the ovaries. COMPARISON:  Pelvic MRI 10/09/2016 FINDINGS: Uterus Measurements: 11.9 x 7.9 x 8.4 cm. Multiple uterine fibroids are demonstrated. There is a 6.1 x 6.7 x 7.3 cm partially exophytic fibroid off the uterine fundus, a 2.6 x 2.6 x 2.3 cm fibroid within the anterior uterine body and a 2.0 x 2.2 x 1.9 cm fibroid within the posterior uterine body. Additional fibroids are demonstrated. Endometrium Thickness: 10 mm. There is mixed echogenicity material within the endometrial canal. Right ovary Measurements: 2.8 x 1.7 x 2.0 cm. Normal appearance/no adnexal mass. Left ovary Measurements: 4.2 x 3 1 x 3.5 cm. There is a 3.1 x 2.5 x 2.0 cm cyst within the left ovary. Pulsed Doppler evaluation of both ovaries demonstrates normal low-resistance arterial and venous waveforms. Other findings No abnormal free fluid. IMPRESSION: 1. The endometrial canal is expanded and contains mixed echogenicity material which may represent complex fluid or blood products. Recommend clinical correlation to exclude the possibility of a cervical mass as obstructing lesion. 2. Arterial and venous flow demonstrated to the right and left ovaries. No sonographic evidence to suggest torsion. 3. Enlarged fibroid uterus. Electronically Signed   By: Lovey Newcomer M.D.   On: 06/16/2017 14:55   US Pelvis Complete  Result Date: 06/16/2017 CLINICAL DATA:  Patient with pelvic and right lower quadrant pain for 3 days. EXAM: TRANSABDOMINAL AND TRANSVAGINAL ULTRASOUND OF PELVIS DOPPLER ULTRASOUND OF OVARIES TECHNIQUE: Both transabdominal and transvaginal ultrasound examinations of the pelvis were performed.  Transabdominal technique was performed for global imaging of the pelvis including uterus, ovaries, adnexal regions, and pelvic cul-de-sac. It was necessary to proceed with endovaginal exam following the transabdominal exam to visualize the endometrium and adnexal structures. Color and duplex Doppler ultrasound was utilized to evaluate blood flow to the ovaries. COMPARISON:  Pelvic MRI 10/09/2016 FINDINGS: Uterus Measurements: 11.9 x 7.9 x 8.4 cm. Multiple uterine fibroids are demonstrated. There is a 6.1 x 6.7 x 7.3 cm partially exophytic fibroid off the uterine fundus, a 2.6 x 2.6 x 2.3 cm fibroid within the anterior uterine body and a 2.0 x 2.2 x 1.9 cm fibroid within the posterior uterine body. Additional fibroids are demonstrated. Endometrium Thickness: 10 mm. There is mixed echogenicity material within the endometrial canal. Right ovary Measurements: 2.8 x 1.7 x 2.0 cm. Normal appearance/no adnexal mass. Left ovary Measurements: 4.2 x 3 1 x 3.5 cm. There is a 3.1 x 2.5 x 2.0 cm cyst within the left ovary. Pulsed Doppler evaluation of both ovaries demonstrates normal low-resistance arterial and venous waveforms. Other findings No abnormal free fluid. IMPRESSION: 1. The endometrial canal is expanded and contains mixed echogenicity material which may represent complex fluid or blood products. Recommend clinical correlation to exclude the possibility of a cervical mass as obstructing lesion. 2. Arterial and venous flow demonstrated to the right and left  ovaries. No sonographic evidence to suggest torsion. 3. Enlarged fibroid uterus. Electronically Signed   By: Lovey Newcomer M.D.   On: 06/16/2017 14:55   [place] Art/ven Flow Abd Pelv Doppler  Result Date: 06/16/2017 CLINICAL DATA:  Patient with pelvic and right lower quadrant pain for 3 days. EXAM: TRANSABDOMINAL AND TRANSVAGINAL ULTRASOUND OF PELVIS DOPPLER ULTRASOUND OF OVARIES TECHNIQUE: Both transabdominal and transvaginal ultrasound examinations of the pelvis  were performed. Transabdominal technique was performed for global imaging of the pelvis including uterus, ovaries, adnexal regions, and pelvic cul-de-sac. It was necessary to proceed with endovaginal exam following the transabdominal exam to visualize the endometrium and adnexal structures. Color and duplex Doppler ultrasound was utilized to evaluate blood flow to the ovaries. COMPARISON:  Pelvic MRI 10/09/2016 FINDINGS: Uterus Measurements: 11.9 x 7.9 x 8.4 cm. Multiple uterine fibroids are demonstrated. There is a 6.1 x 6.7 x 7.3 cm partially exophytic fibroid off the uterine fundus, a 2.6 x 2.6 x 2.3 cm fibroid within the anterior uterine body and a 2.0 x 2.2 x 1.9 cm fibroid within the posterior uterine body. Additional fibroids are demonstrated. Endometrium Thickness: 10 mm. There is mixed echogenicity material within the endometrial canal. Right ovary Measurements: 2.8 x 1.7 x 2.0 cm. Normal appearance/no adnexal mass. Left ovary Measurements: 4.2 x 3 1 x 3.5 cm. There is a 3.1 x 2.5 x 2.0 cm cyst within the left ovary. Pulsed Doppler evaluation of both ovaries demonstrates normal low-resistance arterial and venous waveforms. Other findings No abnormal free fluid. IMPRESSION: 1. The endometrial canal is expanded and contains mixed echogenicity material which may represent complex fluid or blood products. Recommend clinical correlation to exclude the possibility of a cervical mass as obstructing lesion. 2. Arterial and venous flow demonstrated to the right and left ovaries. No sonographic evidence to suggest torsion. 3. Enlarged fibroid uterus. Electronically Signed   By: Lovey Newcomer M.D.   On: 06/16/2017 14:55    Assessment/Plan: Uterine fibroids with endometritis vs degeneration of fibroid  Observation. Iv antibiotics and pain management  Berkleigh Beckles S 06/17/2017, 4:46 PM

## 2017-06-18 DIAGNOSIS — D259 Leiomyoma of uterus, unspecified: Secondary | ICD-10-CM | POA: Diagnosis present

## 2017-06-18 DIAGNOSIS — N857 Hematometra: Secondary | ICD-10-CM | POA: Diagnosis present

## 2017-06-18 MED ORDER — KETOROLAC TROMETHAMINE 30 MG/ML IJ SOLN
30.0000 mg | Freq: Once | INTRAMUSCULAR | Status: AC
  Administered 2017-06-18: 30 mg via INTRAVENOUS
  Filled 2017-06-18: qty 1

## 2017-06-18 MED ORDER — LISINOPRIL 10 MG PO TABS
10.0000 mg | ORAL_TABLET | Freq: Every day | ORAL | Status: DC
  Administered 2017-06-18 – 2017-06-20 (×3): 10 mg via ORAL
  Filled 2017-06-18 (×3): qty 1

## 2017-06-18 NOTE — Progress Notes (Signed)
Patient ID: Christie [name], female   DOB: [birth year], 46 y.o.   MRN: [id number] FEELS MUCH BETTER TOLERATING LIQUIDS, VOIDING OK ABDOMEN MUCH LESS TENDER POST BS"S TEMP 99 TMAX 102 IMP: ENDOMETRITIS RESOLVINT PLAN: ADVANCE DIET

## 2017-06-19 LAB — CBC
HCT: 30.5 % — ABNORMAL LOW (ref 36.0–46.0)
Hemoglobin: 9.9 g/dL — ABNORMAL LOW (ref 12.0–15.0)
MCH: 28.9 pg (ref 26.0–34.0)
MCHC: 32.5 g/dL (ref 30.0–36.0)
MCV: 89.2 fL (ref 78.0–100.0)
Platelets: 233 10*3/uL (ref 150–400)
RBC: 3.42 MIL/uL — ABNORMAL LOW (ref 3.87–5.11)
RDW: 14.8 % (ref 11.5–15.5)
WBC: 12.2 10*3/uL — ABNORMAL HIGH (ref 4.0–10.5)

## 2017-06-19 NOTE — Progress Notes (Signed)
Patient ID: Christie [name], female   DOB: [birth year], 46 y.o.   MRN: [id number] PASSED OLD BLOOD WITH FOUL ODOR LAST PM VAGINALLY AF VSS ABD SOFT MINIMAL TENDERNESS CBC STABLE IMP: DRAINAGE OF INFECTED HEMATOMETRIUM PLAN: SALINE LOCK PO PAIN MEDS

## 2017-06-19 NOTE — Progress Notes (Signed)
Admission nutrition screen triggered for unintentional weight loss > 10 lbs within the last month. Chart indicates 3/18 weight of 200 lbs, at current recorded weight that is a 5 % loss of usual.  Noted order of regular diet and Boost TID - first line intervention for weight loss. Appetite and weight trend should be monitored in the coming weeks as pt recovers from surgery. Continue Boost at home after discharge   [place].Fredderick Severance LDN Neonatal Nutrition Support Specialist/RD III Pager [phone]      Phone [phone]

## 2017-06-19 NOTE — Plan of Care (Signed)
Patient reassured and calmed after bleeding episode. Assisted with self care and gown change. Patient able to understand condition and the source of the bleeding. Reminded to call for help to get OOB for BR due to having the PCA dilaudid.

## 2017-06-20 DIAGNOSIS — N857 Hematometra: Secondary | ICD-10-CM

## 2017-06-20 MED ORDER — OXYCODONE-ACETAMINOPHEN 7.5-325 MG PO TABS: 1.0000 | ORAL_TABLET | Freq: Three times a day (TID) | ORAL | 0 refills | Status: AC | PRN

## 2017-06-20 MED ORDER — AMOXICILLIN-POT CLAVULANATE 875-125 MG PO TABS: 1.0000 | ORAL_TABLET | Freq: Two times a day (BID) | ORAL | 0 refills | Status: AC

## 2017-06-20 NOTE — Discharge Summary (Signed)
Patient name  Christie [name], Thakur DICTATION# [id number] CSN# [id number]  [facility], MD 06/20/2017 8:50 AM

## 2017-06-20 NOTE — Plan of Care (Signed)
Patient status improving.

## 2017-06-20 NOTE — Progress Notes (Signed)
Pt ambulated out  Teaching complete 

## 2017-06-20 NOTE — Progress Notes (Signed)
Patient ID: Christie [name], female   DOB: 03-26-1971, 46 y.o.   MRN: [id number] Pain resolved. Tolerating diet. Voiding. Positive flatus minimal vaginal bleeding AF VSS ABD SOFT MINIMAL TENDERNESS IMP: Tice

## 2017-06-21 ENCOUNTER — Other Ambulatory Visit: Payer: Self-pay | Admitting: Obstetrics and Gynecology

## 2017-06-21 DIAGNOSIS — R928 Other abnormal and inconclusive findings on diagnostic imaging of breast: Secondary | ICD-10-CM

## 2017-06-21 NOTE — Discharge Summary (Signed)
[name], Christie [name] NO.:  [id number]  MEDICAL RECORD NO.:  [id number]  LOCATION:                                 FACILITY:  PHYSICIAN:  Darlyn Chamber, M.D.   DATE OF BIRTH:  Feb 04, 1971  DATE OF ADMISSION:  06/17/2017 DATE OF DISCHARGE:  06/20/2017                              DISCHARGE SUMMARY   ADMITTING DIAGNOSES:  New fibroids with fever and abdominal pain, possible endometritis versus degenerating fibroids.  DISCHARGE DIAGNOSIS:  Infected hematometra.  For complete history and physical, please see dictated note.  COURSE IN THE HOSPITAL:  The patient was brought in and began on IV antibiotics.  Two days ago, she had some dark vaginal bleeding with odor, consistent with an infected hematometra that drained.  Subsequent to that, her fever defervesced, white count decreased, her hemoglobin was 9.9.  On the date of discharge, she was afebrile with stable vital signs.  Abdomen was soft, much less tender.  She was voiding without difficulty.  Her bowels were functioning.  She was tolerating diet and was discharged home.  In terms of complications, none were encountered during her stay in the hospital.  The patient was discharged home in stable condition.  DISPOSITION:  The patient is to report if fever or excessive pain. Discharged home to continue Augmentin for a few more days.  We will plan followup in the office in 1 week.     Darlyn Chamber, M.D.     JSM/MEDQ  D:  06/20/2017  T:  06/21/2017  Job:  [id number]

## 2017-06-24 NOTE — H&P (Signed)
Patient name Christie [name], Christie [name] DICTATION# [id number] CSN# [id number]  [facility], MD 06/24/2017 6:17 AM

## 2017-06-25 NOTE — H&P (Signed)
NAMEGARRETT, [name] NO.:  [id number]  MEDICAL RECORD NO.:  [id number]  LOCATION:                                 FACILITY:  PHYSICIAN:  Darlyn Chamber, M.D.   DATE OF BIRTH:  01-Dec-1970  DATE OF ADMISSION: DATE OF DISCHARGE:                             HISTORY & PHYSICAL   DATE OF ADMISSION:  July 08, 2017, at [place]:  The patient is a __________-year-old, gravida 3, para 8 female, who presents for total abdominal hysterectomy. The patient has a history of uterine fibroids with associated menorrhagia.  Because of this, she underwent hysteroscopy and NovaSure ablation in 2016.  Has been doing relatively well, was developing increasing pain and discomfort.  Ultrasound revealed enlarging uterine fibroids.  She subsequently underwent a saline infusion and endometrial sampling last month.  Pathology was benign.  She developed an intrauterine infection.  Additionally, drain was felt to be hematometra that was causing the infection and resulting pain.  She is still scheduled for total abdominal hysterectomy.  ALLERGIES:  In terms of allergies, she has no known drug allergies.  MEDICATIONS:  She is on lisinopril for hypertension, and ibuprofen.  PAST MEDICAL HISTORY:  She has had three cesarean sections, is being managed for hypertension.  SOCIAL HISTORY:  Reveals tobacco use, but no alcohol use.  FAMILY HISTORY:  Noncontributory.  REVIEW OF SYSTEMS:  Noncontributory.  PHYSICAL EXAMINATION:  VITAL SIGNS:  The patient is afebrile with stable vital signs. HEENT:  The patient is normocephalic.  Pupils equal, round, reactive to light and accommodation.  Extraocular movements were intact.  Sclerae and conjunctivae are clear.  Oropharynx clear. NECK:  Without thyromegaly. LUNGS:  Clear. CARDIOVASCULAR SYSTEM:  Regular rate.  No murmurs or gallops.  No carotid or abdominal bruits. ABDOMEN:  Uterus enlarged about the  umbilicus, moderately tender.  Low transverse incision. PELVIC:  Normal external genitalia.  Vaginal cuff is clear.  Cervix unremarkable.  Uterus 16 weeks in size.  Adnexa unremarkable. EXTREMITIES:  Trace edema. NEUROLOGIC:  Grossly within normal limits.  IMPRESSION: 1. Enlarging uterine fibroids with associated symptomatology. 2. Hypertension.  PLAN:  The patient will undergo total abdominal hysterectomy.  We will also remove both fallopian tubes.  The risks of surgery have been discussed including the risk of infection.  Risk of hemorrhage that could require transfusion with the risk of AIDS or hepatitis.  Risk of injury to adjacent organs including bladder, bowel, ureters that could require further exploratory surgery.  Risk of deep venous thrombosis and pulmonary embolus.  The patient expressed understanding of the indications and risks.     Darlyn Chamber, M.D.     JSM/MEDQ  D:  06/24/2017  T:  06/25/2017  Job:  [id number]

## 2017-07-02 NOTE — Patient Instructions (Addendum)
Your procedure is scheduled on: Monday July 08, 2017 at 7:30 am  Enter through the [employer] [facility] at: 6:00 am  Pick up the phone at the desk and dial [phone].  Call this number if you have problems the morning of surgery: [phone].  Remember: Do NOT eat food or drink any liquids after :Midnight on Sunday December 16  Take these medicines the morning of surgery with a SIP OF WATER: NONE  STOP ALL VITAMINS AND SUPPLEMENTS 1 WEEK PRIOR TO SURGERY  DO NOT SMOKE DAY OF SURGERY  Do NOT wear jewelry (body piercing), metal hair clips/bobby pins, make-up, artificial eye lashes, or nail polish. Do NOT wear lotions, powders, or perfumes.  You may wear deoderant. Do NOT shave for 48 hours prior to surgery. Do NOT bring valuables to the hospital. Contacts, dentures, or bridgework may not be worn into surgery. Leave suitcase in car.  After surgery it may be brought to your room.   For patients admitted to the hospital, checkout time is 11:00 AM the day of discharge.

## 2017-07-05 ENCOUNTER — Other Ambulatory Visit: Payer: Self-pay

## 2017-07-05 ENCOUNTER — Ambulatory Visit: Payer: Self-pay

## 2017-07-05 ENCOUNTER — Encounter (HOSPITAL_COMMUNITY): Payer: Self-pay

## 2017-07-05 ENCOUNTER — Encounter (HOSPITAL_COMMUNITY)
Admission: RE | Admit: 2017-07-05 | Discharge: 2017-07-05 | Disposition: A | Discharge status: Home / Self Care | Payer: Commercial Managed Care - PPO | Source: Ambulatory Visit | Attending: Obstetrics and Gynecology | Admitting: Obstetrics and Gynecology

## 2017-07-05 HISTORY — DX: Unspecified infectious disease: B99.9

## 2017-07-05 HISTORY — DX: Essential (primary) hypertension: I10

## 2017-07-05 LAB — CBC
HEMATOCRIT: 36.1 % (ref 36.0–46.0)
HEMOGLOBIN: 11.1 g/dL — AB (ref 12.0–15.0)
MCH: 28 pg (ref 26.0–34.0)
MCHC: 30.7 g/dL (ref 30.0–36.0)
MCV: 90.9 fL (ref 78.0–100.0)
Platelets: 582 10*3/uL — ABNORMAL HIGH (ref 150–400)
RBC: 3.97 MIL/uL (ref 3.87–5.11)
RDW: 15.1 % (ref 11.5–15.5)
WBC: 8.3 10*3/uL (ref 4.0–10.5)

## 2017-07-05 LAB — COMPREHENSIVE METABOLIC PANEL
ALBUMIN: 3.6 g/dL (ref 3.5–5.0)
ALK PHOS: 60 U/L (ref 38–126)
ALT: 9 U/L — AB (ref 14–54)
ANION GAP: 10 (ref 5–15)
AST: 14 U/L — ABNORMAL LOW (ref 15–41)
BUN: 9 mg/dL (ref 6–20)
CALCIUM: 8.8 mg/dL — AB (ref 8.9–10.3)
CO2: 23 mmol/L (ref 22–32)
CREATININE: 0.67 mg/dL (ref 0.44–1.00)
Chloride: 102 mmol/L (ref 101–111)
GFR calc Af Amer: >60 mL/min (ref >60)
GFR calc non Af Amer: >60 mL/min (ref >60)
GLUCOSE: 152 mg/dL — AB (ref 65–99)
Potassium: 3.3 mmol/L — ABNORMAL LOW (ref 3.5–5.1)
Sodium: 135 mmol/L (ref 135–145)
TOTAL PROTEIN: 8.1 g/dL (ref 6.5–8.1)
Total Bilirubin: 0.3 mg/dL (ref 0.3–1.2)

## 2017-07-05 LAB — TYPE AND SCREEN
ABO/RH(D): O POS
Antibody Screen: NEGATIVE

## 2017-07-05 LAB — ABO/RH: ABO/RH(D): O POS

## 2017-07-05 NOTE — Pre-Procedure Instructions (Signed)
EKG VIEWED AND OKAY 'D BY DR  Sabra Heck

## 2017-07-05 NOTE — Pre-Procedure Instructions (Signed)
DR. Sabra Heck VIEWED AND OKAY 'D EKG

## 2017-07-05 NOTE — Pre-Procedure Instructions (Signed)
DR. Sabra Heck VIEWED AND OKAY' D EKG

## 2017-07-07 NOTE — Anesthesia Preprocedure Evaluation (Signed)
Anesthesia Evaluation  Patient identified by MRN, date of birth, ID band Patient awake    Reviewed: Allergy & Precautions, H&P , Patient's Chart, lab work & pertinent test results, reviewed documented beta blocker date and time   Airway Mallampati: II  TM Distance: >3 FB Neck ROM: full    Dental no notable dental hx.    Pulmonary Current Smoker,    Pulmonary exam normal breath sounds clear to auscultation       Cardiovascular hypertension,  Rhythm:regular Rate:Normal     Neuro/Psych    GI/Hepatic   Endo/Other    Renal/GU      Musculoskeletal   Abdominal   Peds  Hematology   Anesthesia Other Findings   Reproductive/Obstetrics                             Anesthesia Physical Anesthesia Plan  ASA: II  Anesthesia Plan: General   Post-op Pain Management:    Induction: Intravenous  PONV Risk Score and Plan: 3 and Dexamethasone, Ondansetron, Scopolamine patch - Pre-op and Treatment may vary due to age or medical condition  Airway Management Planned: Oral ETT  Additional Equipment:   Intra-op Plan:   Post-operative Plan: Extubation in OR  Informed Consent: I have reviewed the patients History and Physical, chart, labs and discussed the procedure including the risks, benefits and alternatives for the proposed anesthesia with the patient or authorized representative who has indicated his/her understanding and acceptance.   Dental Advisory Given  Plan Discussed with: CRNA and Surgeon  Anesthesia Plan Comments: (  )        Anesthesia Quick Evaluation

## 2017-07-08 ENCOUNTER — Inpatient Hospital Stay (HOSPITAL_COMMUNITY): Payer: Commercial Managed Care - PPO | Admitting: Anesthesiology

## 2017-07-08 ENCOUNTER — Other Ambulatory Visit: Payer: Self-pay

## 2017-07-08 ENCOUNTER — Encounter (HOSPITAL_COMMUNITY)
Admission: RE | Disposition: A | Discharge status: Home / Self Care | Payer: Self-pay | Source: Ambulatory Visit | Attending: Obstetrics and Gynecology

## 2017-07-08 ENCOUNTER — Inpatient Hospital Stay (HOSPITAL_COMMUNITY)
Admission: RE | Admit: 2017-07-08 | Discharge: 2017-07-09 | DRG: 743 | Disposition: A | Discharge status: Home / Self Care | Payer: Commercial Managed Care - PPO | Source: Ambulatory Visit | Attending: Obstetrics and Gynecology | Admitting: Obstetrics and Gynecology

## 2017-07-08 ENCOUNTER — Encounter (HOSPITAL_COMMUNITY): Payer: Self-pay

## 2017-07-08 DIAGNOSIS — F17200 Nicotine dependence, unspecified, uncomplicated: Secondary | ICD-10-CM | POA: Diagnosis present

## 2017-07-08 DIAGNOSIS — I10 Essential (primary) hypertension: Secondary | ICD-10-CM | POA: Diagnosis present

## 2017-07-08 DIAGNOSIS — D259 Leiomyoma of uterus, unspecified: Secondary | ICD-10-CM | POA: Diagnosis present

## 2017-07-08 DIAGNOSIS — Z90710 Acquired absence of both cervix and uterus: Secondary | ICD-10-CM | POA: Diagnosis present

## 2017-07-08 HISTORY — PX: HYSTERECTOMY ABDOMINAL WITH SALPINGECTOMY: SHX6725

## 2017-07-08 LAB — PREGNANCY, URINE: Preg Test, Ur: POSITIVE — AB

## 2017-07-08 LAB — HCG, SERUM, QUALITATIVE: Preg, Serum: NEGATIVE

## 2017-07-08 SURGERY — HYSTERECTOMY, TOTAL, ABDOMINAL, WITH SALPINGECTOMY
Anesthesia: General | Laterality: Bilateral

## 2017-07-08 MED ORDER — FENTANYL CITRATE (PF) 250 MCG/5ML IJ SOLN
INTRAMUSCULAR | Status: AC
  Filled 2017-07-08: qty 5

## 2017-07-08 MED ORDER — PROPOFOL 10 MG/ML IV BOLUS
INTRAVENOUS | Status: AC
  Filled 2017-07-08: qty 20

## 2017-07-08 MED ORDER — ONDANSETRON HCL 4 MG/2ML IJ SOLN: 4.0000 mg | Freq: Four times a day (QID) | INTRAMUSCULAR | Status: DC | PRN

## 2017-07-08 MED ORDER — SODIUM CHLORIDE 0.9% FLUSH: 9.0000 mL | INTRAVENOUS | Status: DC | PRN

## 2017-07-08 MED ORDER — LIDOCAINE HCL 1 % IJ SOLN
INTRAMUSCULAR | Status: AC
  Filled 2017-07-08: qty 20

## 2017-07-08 MED ORDER — MENTHOL 3 MG MT LOZG: 1.0000 | LOZENGE | OROMUCOSAL | Status: DC | PRN

## 2017-07-08 MED ORDER — HYDROMORPHONE HCL 1 MG/ML IJ SOLN
INTRAMUSCULAR | Status: AC
  Filled 2017-07-08: qty 0.5

## 2017-07-08 MED ORDER — LISINOPRIL 10 MG PO TABS
10.0000 mg | ORAL_TABLET | Freq: Every day | ORAL | Status: DC
  Administered 2017-07-08 – 2017-07-09 (×2): 10 mg via ORAL
  Filled 2017-07-08 (×2): qty 1

## 2017-07-08 MED ORDER — LIDOCAINE HCL (CARDIAC) 20 MG/ML IV SOLN
INTRAVENOUS | Status: AC
  Filled 2017-07-08: qty 5

## 2017-07-08 MED ORDER — LIDOCAINE HCL (CARDIAC) 20 MG/ML IV SOLN
INTRAVENOUS | Status: DC | PRN
  Administered 2017-07-08: 100 mg via INTRAVENOUS

## 2017-07-08 MED ORDER — OXYCODONE-ACETAMINOPHEN 5-325 MG PO TABS
1.0000 | ORAL_TABLET | ORAL | Status: DC | PRN
  Administered 2017-07-08 – 2017-07-09 (×4): 2 via ORAL
  Filled 2017-07-08 (×4): qty 2

## 2017-07-08 MED ORDER — DIPHENHYDRAMINE HCL 50 MG/ML IJ SOLN: 12.5000 mg | Freq: Four times a day (QID) | INTRAMUSCULAR | Status: DC | PRN

## 2017-07-08 MED ORDER — GLYCOPYRROLATE 0.2 MG/ML IJ SOLN
INTRAMUSCULAR | Status: AC
  Filled 2017-07-08: qty 1

## 2017-07-08 MED ORDER — SCOPOLAMINE 1 MG/3DAYS TD PT72
1.0000 | MEDICATED_PATCH | Freq: Once | TRANSDERMAL | Status: DC
  Administered 2017-07-08: 1.5 mg via TRANSDERMAL

## 2017-07-08 MED ORDER — PROPOFOL 10 MG/ML IV BOLUS
INTRAVENOUS | Status: DC | PRN
  Administered 2017-07-08: 200 mg via INTRAVENOUS

## 2017-07-08 MED ORDER — FENTANYL CITRATE (PF) 100 MCG/2ML IJ SOLN
INTRAMUSCULAR | Status: DC | PRN
  Administered 2017-07-08: 100 ug via INTRAVENOUS
  Administered 2017-07-08: 50 ug via INTRAVENOUS
  Administered 2017-07-08: 100 ug via INTRAVENOUS
  Administered 2017-07-08 (×3): 50 ug via INTRAVENOUS
  Administered 2017-07-08: 100 ug via INTRAVENOUS

## 2017-07-08 MED ORDER — ONDANSETRON HCL 4 MG/2ML IJ SOLN
INTRAMUSCULAR | Status: AC
  Filled 2017-07-08: qty 2

## 2017-07-08 MED ORDER — MIDAZOLAM HCL 2 MG/2ML IJ SOLN
INTRAMUSCULAR | Status: AC
  Filled 2017-07-08: qty 2

## 2017-07-08 MED ORDER — HYDROMORPHONE HCL 1 MG/ML IJ SOLN
0.2500 mg | INTRAMUSCULAR | Status: DC | PRN
Start: 2017-07-08 — End: 2017-07-08
  Administered 2017-07-08 (×3): 0.5 mg via INTRAVENOUS

## 2017-07-08 MED ORDER — CEFAZOLIN SODIUM-DEXTROSE 2-4 GM/100ML-% IV SOLN
2.0000 g | INTRAVENOUS | Status: AC
  Administered 2017-07-08: 2 g via INTRAVENOUS
  Filled 2017-07-08: qty 100

## 2017-07-08 MED ORDER — GLYCOPYRROLATE 0.2 MG/ML IJ SOLN
INTRAMUSCULAR | Status: AC
  Filled 2017-07-08: qty 3

## 2017-07-08 MED ORDER — LACTATED RINGERS IV SOLN
INTRAVENOUS | Status: DC
  Administered 2017-07-08 – 2017-07-09 (×3): via INTRAVENOUS

## 2017-07-08 MED ORDER — ROCURONIUM BROMIDE 100 MG/10ML IV SOLN
INTRAVENOUS | Status: AC
  Filled 2017-07-08: qty 1

## 2017-07-08 MED ORDER — LACTATED RINGERS IV SOLN
INTRAVENOUS | Status: DC
  Administered 2017-07-08 (×2): via INTRAVENOUS

## 2017-07-08 MED ORDER — DEXAMETHASONE SODIUM PHOSPHATE 10 MG/ML IJ SOLN
INTRAMUSCULAR | Status: AC
  Filled 2017-07-08: qty 1

## 2017-07-08 MED ORDER — NALOXONE HCL 0.4 MG/ML IJ SOLN: 0.4000 mg | INTRAMUSCULAR | Status: DC | PRN

## 2017-07-08 MED ORDER — GLYCOPYRROLATE 0.2 MG/ML IJ SOLN
INTRAMUSCULAR | Status: DC | PRN
  Administered 2017-07-08: 0.6 mg via INTRAVENOUS
  Administered 2017-07-08 (×2): 0.1 mg via INTRAVENOUS

## 2017-07-08 MED ORDER — DEXAMETHASONE SODIUM PHOSPHATE 10 MG/ML IJ SOLN
INTRAMUSCULAR | Status: DC | PRN
  Administered 2017-07-08: 10 mg via INTRAVENOUS

## 2017-07-08 MED ORDER — NEOSTIGMINE METHYLSULFATE 10 MG/10ML IV SOLN
INTRAVENOUS | Status: AC
  Filled 2017-07-08: qty 1

## 2017-07-08 MED ORDER — ACETAMINOPHEN 325 MG PO TABS: 650.0000 mg | ORAL_TABLET | ORAL | Status: DC | PRN

## 2017-07-08 MED ORDER — MIDAZOLAM HCL 2 MG/2ML IJ SOLN
INTRAMUSCULAR | Status: DC | PRN
  Administered 2017-07-08: 0.5 mg via INTRAVENOUS
  Administered 2017-07-08: 1.5 mg via INTRAVENOUS

## 2017-07-08 MED ORDER — HYDROMORPHONE 1 MG/ML IV SOLN
INTRAVENOUS | Status: DC
  Administered 2017-07-08: 11:00:00 via INTRAVENOUS
  Administered 2017-07-08: 1.4 mg via INTRAVENOUS
  Administered 2017-07-08: 1.6 mg via INTRAVENOUS
  Filled 2017-07-08: qty 25

## 2017-07-08 MED ORDER — KETOROLAC TROMETHAMINE 30 MG/ML IJ SOLN
INTRAMUSCULAR | Status: AC
  Filled 2017-07-08: qty 1

## 2017-07-08 MED ORDER — ROCURONIUM BROMIDE 100 MG/10ML IV SOLN
INTRAVENOUS | Status: DC | PRN
  Administered 2017-07-08: 50 mg via INTRAVENOUS

## 2017-07-08 MED ORDER — SCOPOLAMINE 1 MG/3DAYS TD PT72
MEDICATED_PATCH | TRANSDERMAL | Status: AC
  Administered 2017-07-08: 1.5 mg via TRANSDERMAL
  Filled 2017-07-08: qty 1

## 2017-07-08 MED ORDER — ONDANSETRON HCL 4 MG PO TABS: 4.0000 mg | ORAL_TABLET | Freq: Four times a day (QID) | ORAL | Status: DC | PRN

## 2017-07-08 MED ORDER — KETOROLAC TROMETHAMINE 30 MG/ML IJ SOLN
INTRAMUSCULAR | Status: DC | PRN
  Administered 2017-07-08: 30 mg via INTRAVENOUS

## 2017-07-08 MED ORDER — NEOSTIGMINE METHYLSULFATE 10 MG/10ML IV SOLN
INTRAVENOUS | Status: DC | PRN
  Administered 2017-07-08: 4 mg via INTRAVENOUS

## 2017-07-08 MED ORDER — DIPHENHYDRAMINE HCL 12.5 MG/5ML PO ELIX: 12.5000 mg | ORAL_SOLUTION | Freq: Four times a day (QID) | ORAL | Status: DC | PRN

## 2017-07-08 MED ORDER — CEFAZOLIN SODIUM-DEXTROSE 2-3 GM-%(50ML) IV SOLR
INTRAVENOUS | Status: AC
  Filled 2017-07-08: qty 50

## 2017-07-08 MED ORDER — ONDANSETRON HCL 4 MG/2ML IJ SOLN
INTRAMUSCULAR | Status: DC | PRN
  Administered 2017-07-08 (×2): 2 mg via INTRAVENOUS

## 2017-07-08 SURGICAL SUPPLY — 37 items
CANISTER SUCT 3000ML PPV (MISCELLANEOUS) ×3 IMPLANT
CLOTH BEACON ORANGE TIMEOUT ST (SAFETY) ×3 IMPLANT
DRAPE CESAREAN BIRTH W POUCH (DRAPES) ×3 IMPLANT
DRAPE WARM FLUID 44X44 (DRAPE) ×3 IMPLANT
DRSG OPSITE POSTOP 4X10 (GAUZE/BANDAGES/DRESSINGS) ×3 IMPLANT
DURAPREP 26ML APPLICATOR (WOUND CARE) ×3 IMPLANT
GAUZE SPONGE 4X4 16PLY XRAY LF (GAUZE/BANDAGES/DRESSINGS) IMPLANT
GLOVE BIO SURGEON STRL SZ7 (GLOVE) ×3 IMPLANT
GLOVE BIOGEL PI IND STRL 7.0 (GLOVE) ×3 IMPLANT
GLOVE BIOGEL PI INDICATOR 7.0 (GLOVE) ×6
GOWN STRL REUS W/TWL LRG LVL3 (GOWN DISPOSABLE) ×6 IMPLANT
NEEDLE HYPO 22GX1.5 SAFETY (NEEDLE) ×3 IMPLANT
NS IRRIG 1000ML POUR BTL (IV SOLUTION) ×3 IMPLANT
PACK ABDOMINAL GYN (CUSTOM PROCEDURE TRAY) ×3 IMPLANT
PAD OB MATERNITY 4.3X12.25 (PERSONAL CARE ITEMS) ×3 IMPLANT
PENCIL SMOKE EVAC W/HOLSTER (ELECTROSURGICAL) ×3 IMPLANT
PROTECTOR NERVE ULNAR (MISCELLANEOUS) ×3 IMPLANT
SPONGE LAP 18X18 X RAY DECT (DISPOSABLE) ×6 IMPLANT
STAPLER VISISTAT 35W (STAPLE) IMPLANT
SUT CHROMIC 0 SH (SUTURE) IMPLANT
SUT CHROMIC 2 0 SH (SUTURE) IMPLANT
SUT CHROMIC 3 0 SH 27 (SUTURE) IMPLANT
SUT MON AB 4-0 PS1 27 (SUTURE) IMPLANT
SUT PDS AB 0 CT 36 (SUTURE) ×3 IMPLANT
SUT PDS AB 0 CTX 60 (SUTURE) ×3 IMPLANT
SUT VIC AB 0 CT1 18XCR BRD8 (SUTURE) ×3 IMPLANT
SUT VIC AB 0 CT1 27 (SUTURE) ×2
SUT VIC AB 0 CT1 27XBRD ANBCTR (SUTURE) ×1 IMPLANT
SUT VIC AB 0 CT1 8-18 (SUTURE) ×6
SUT VIC AB 2-0 CT1 (SUTURE) ×3 IMPLANT
SUT VICRYL 0 TIES 12 18 (SUTURE) ×3 IMPLANT
SYR 30ML LL (SYRINGE) ×3 IMPLANT
TOWEL OR 17X24 6PK STRL BLUE (TOWEL DISPOSABLE) ×6 IMPLANT
TRAY FOLEY CATH SILVER 14FR (SET/KITS/TRAYS/PACK) ×3 IMPLANT
TUBING CONNECTING 10 (TUBING) ×2 IMPLANT
TUBING CONNECTING 10' (TUBING) ×1
YANKAUER SUCT BULB TIP NO VENT (SUCTIONS) ×3 IMPLANT

## 2017-07-08 NOTE — Anesthesia Procedure Notes (Signed)
Procedure Name: Intubation Date/Time: 07/08/2017 7:26 AM Performed by: Lyndle Herrlich, MD Pre-anesthesia Checklist: Patient identified, Patient being monitored, Timeout performed, Emergency Drugs available and Suction available Patient Re-evaluated:Patient Re-evaluated prior to induction Oxygen Delivery Method: Circle System Utilized Preoxygenation: Pre-oxygenation with 100% oxygen Induction Type: IV induction Ventilation: Mask ventilation without difficulty Laryngoscope Size: Miller and 2 Grade View: Grade II Tube type: Oral Tube size: 7.0 mm Number of attempts: 1 Airway Equipment and Method: stylet Placement Confirmation: ETT inserted through vocal cords under direct vision,  positive ETCO2 and breath sounds checked- equal and bilateral Secured at: 22 cm Tube secured with: Tape Dental Injury: Teeth and Oropharynx as per pre-operative assessment

## 2017-07-08 NOTE — Addendum Note (Signed)
Addendum  created 07/08/17 1359 by Raenette Rover, CRNA   Sign clinical note

## 2017-07-08 NOTE — Transfer of Care (Signed)
Immediate Anesthesia Transfer of Care Note  Patient: ULYANA PITONES  Procedure(s) Performed: HYSTERECTOMY ABDOMINAL WITH SALPINGECTOMY (Bilateral )  Patient Location: PACU  Anesthesia Type:General  Level of Consciousness: awake, alert  and oriented  Airway & Oxygen Therapy: Patient Spontanous Breathing and Patient connected to nasal cannula oxygen  Post-op Assessment: Report given to RN, Post -op Vital signs reviewed and stable and Patient moving all extremities X 4  Post vital signs: Reviewed and stable  Last Vitals:  Vitals:   07/08/17 0602 07/08/17 0908  BP: (!) 138/91 (!) (P) 156/88  Pulse: 88 (P) 81  Resp: 18   Temp: 36.5 C (P) 36.5 C  SpO2: 98% (P) 100%    Last Pain:  Vitals:   07/08/17 0602  TempSrc: Oral      Patients Stated Pain Goal: 5 (62/56/38 9373)  Complications: No apparent anesthesia complications

## 2017-07-08 NOTE — Progress Notes (Signed)
Patient ID: Christie [name], female   DOB: 12-Dec-1970, 46 y.o.   MRN: [id number] AF VSS DRESSING DRY GOOD UO MINIMAL SPOTTING CONTINUE POST OP CARE

## 2017-07-08 NOTE — Anesthesia Postprocedure Evaluation (Signed)
Anesthesia Post Note  Patient: Christie [name]  Procedure(s) Performed: HYSTERECTOMY ABDOMINAL WITH SALPINGECTOMY (Bilateral )     Patient location during evaluation: [facility] Anesthesia Type: General Level of consciousness: awake, awake and alert, oriented and patient cooperative Pain management: pain level controlled Vital Signs Assessment: post-procedure vital signs reviewed and stable Respiratory status: spontaneous breathing, nonlabored ventilation and respiratory function stable Cardiovascular status: stable Postop Assessment: no apparent nausea or vomiting Anesthetic complications: no    Last Vitals:  Vitals:   07/08/17 1150 07/08/17 1251  BP: (!) 159/92 (!) 168/99  Pulse: 93 95  Resp: 15 16  Temp: 36.7 C 36.6 C  SpO2: 97% 95%    Last Pain:  Vitals:   07/08/17 1251  TempSrc: Oral  PainSc:    Pain Goal: Patients Stated Pain Goal: 3 (07/08/17 1050)               Adela Esteban L

## 2017-07-08 NOTE — Anesthesia Postprocedure Evaluation (Signed)
Anesthesia Post Note  Patient: Christie [name]  Procedure(s) Performed: HYSTERECTOMY ABDOMINAL WITH SALPINGECTOMY (Bilateral )     Patient location during evaluation: PACU Anesthesia Type: General Level of consciousness: awake and alert Pain management: pain level controlled Vital Signs Assessment: post-procedure vital signs reviewed and stable Respiratory status: spontaneous breathing, nonlabored ventilation, respiratory function stable and patient connected to nasal cannula oxygen Cardiovascular status: blood pressure returned to baseline and stable Postop Assessment: no apparent nausea or vomiting Anesthetic complications: no    Last Vitals:  Vitals:   07/08/17 1150 07/08/17 1251  BP: (!) 159/92 (!) 168/99  Pulse: 93 95  Resp: 15 16  Temp: 36.7 C 36.6 C  SpO2: 97% 95%    Last Pain:  Vitals:   07/08/17 1251  TempSrc: Oral  PainSc:    Pain Goal: Patients Stated Pain Goal: 3 (07/08/17 1050)               [name] EDWARD

## 2017-07-08 NOTE — H&P (Signed)
  History and physical exam unchanged 

## 2017-07-08 NOTE — Brief Op Note (Signed)
Patient name Chella, Chapdelaine DICTATION# [id number] CSN# [id number]  [facility], MD 07/08/2017 9:08 AM

## 2017-07-09 ENCOUNTER — Encounter (HOSPITAL_COMMUNITY): Payer: Self-pay | Admitting: Obstetrics and Gynecology

## 2017-07-09 LAB — CBC
HCT: 30.3 % — ABNORMAL LOW (ref 36.0–46.0)
Hemoglobin: 9.4 g/dL — ABNORMAL LOW (ref 12.0–15.0)
MCH: 28.1 pg (ref 26.0–34.0)
MCHC: 31 g/dL (ref 30.0–36.0)
MCV: 90.4 fL (ref 78.0–100.0)
PLATELETS: 467 10*3/uL — AB (ref 150–400)
RBC: 3.35 MIL/uL — AB (ref 3.87–5.11)
RDW: 15.2 % (ref 11.5–15.5)
WBC: 10.9 10*3/uL — AB (ref 4.0–10.5)

## 2017-07-09 MED ORDER — OXYCODONE-ACETAMINOPHEN 7.5-325 MG PO TABS: 1.0000 | ORAL_TABLET | ORAL | 0 refills | Status: AC | PRN

## 2017-07-09 NOTE — Discharge Summary (Signed)
Patient name Christie [name], Christie [name] DICTATION# [id number] CSN# [id number]  [facility], MD 07/09/2017 8:33 AM

## 2017-07-09 NOTE — Op Note (Signed)
[name], [name] NO.:  [id number]  MEDICAL RECORD NO.:  [id number]  LOCATION:                                 FACILITY:  PHYSICIAN:  Darlyn Chamber, M.D.   DATE OF BIRTH:  June 24, 1971  DATE OF PROCEDURE:  07/08/2017 DATE OF DISCHARGE:                              OPERATIVE REPORT   PREOPERATIVE DIAGNOSIS:  Symptomatic uterine fibroids.  POSTOPERATIVE DIAGNOSIS:  Symptomatic uterine fibroids.  OPERATIVE PROCEDURE:  Total abdominal hysterectomy with removal of both fallopian tubes.  SURGEON:  Darlyn Chamber, M.D.  ASSISTANT:  Lucillie Garfinkel, MD.  ANESTHESIA:  General endotracheal.  ESTIMATED BLOOD LOSS:  300 mL.  PACKS:  None.  DRAINS:  Urethral Foley.  INTRAOPERATIVE BLOOD PLACED:  None.  COMPLICATIONS:  None.  INDICATIONS:  Dictated in history and physical.  DESCRIPTION OF PROCEDURE:  The patient was taken to the OR, placed in a supine position.  After satisfactory level of general endotracheal anesthesia was obtained, perineum and vagina were prepped out with Betadine.  Foley was placed to straight drain.  The abdomen was prepped with DuraPrep.  After a period of time, she was draped in a sterile field.  A low transverse skin incision was made with a knife, carried through the subcutaneous tissue.  Fascia was entered sharply.  Incision in the fascia was extended laterally.  Fascia was taken off the muscle superiorly and inferiorly.  Rectus muscles were separated in the midline.  Peritoneum was identified, entered sharply.  Incision of the peritoneum extended both superiorly and inferiorly.  Uterus was then delivered through the abdominal incision.  She had 2 large fibroids, 1 fundal and 1 more intramural.  Both tubes and ovaries were unremarkable. We first went to the right side.  The right fallopian tube was identified.  We clamped the peritoneal attachment, cut that, and secured with a suture ligature of 0 Vicryl.  Next, the right round  ligament was clamped, cut, and suture ligated with 0 Vicryl.  The right utero-ovarian pedicle was isolated, clamped, cut, and secured first with a free tie of 0 Vicryl and then a suture ligature of 0 Vicryl.  Bladder flap was then developed.  Uterine vessels were then clamped, cut, and suture ligated with 0 Vicryl.  We then went to the left side.  The left tube was identified.  First, the left round ligament was clamped, cut, and suture ligated with 0-Vicryl.  The bladder flap was further developed.  The left uteroovarian pedicle was isolated, clamped, cut, and doubly ligated first with a free tie of 0 Vicryl, then a suture ligature of 0 Vicryl. The fallopian tube was then isolated, clamped, cut, and passed off the operative field, held pedicles, secured with suture ligature of 0 Vicryl.  We had good hemostasis.  The left uterine vessels were skeletonized, clamped, cut, and suture ligated with 0 Vicryl.  We then excised the fundus from the underlying lower cervical stump.  Stump was secured with a single-tooth tenaculum.  Next, using the clamp, cut, and tie technique, the parametrium was serially separated from the sides of the lower uterine segment of the cervix.  Vaginal angles were identified, clamped, cut, and  the cervix was passed off the operative field.  Held pedicles, secured with suture ligature of 0 Vicryl. Intervening vaginal mucosa was closed with interrupted figure-of-eights of 0 Vicryl.  Areas of bleeding were brought under control using bipolar and suture ligature of 0 Vicryl.  We had clear urine output.  We thoroughly irrigated the pelvis.  We had good hemostasis at the vaginal cuff along both pelvic sidewalls and with both ovaries.  Appendix was visualized and noted to be normal.  At this point in time, we had placed the pack in that was removed.  Perineum was then closed with running suture 0 Vicryl.  Fascia was closed with double arm running suture of 0 PDS.   Subcutaneous tissue closed with interrupted sutures of 0 plain catgut.  Skin was closed with running subcuticular 4-0 Monocryl and Dermabond.  Sponge, instrument, and needle counts were reported as correct by circulating nurse x2.  Foley catheter remained clear at the time of closure.  The patient tolerated the procedure well and returned to the recovery room in good condition.     Darlyn Chamber, M.D.     JSM/MEDQ  D:  07/08/2017  T:  07/09/2017  Job:  [id number]

## 2017-07-09 NOTE — Progress Notes (Signed)
Discharge instructions and paper prescriptions given to pt. Pt was able to verbalize post-op instructions back to RN. Pt also aware of then to contact MD. Pt has no questions or concerns at this time. IV taken out and pt tolerated well. Pt being walked down in stable condition.

## 2017-07-09 NOTE — Progress Notes (Signed)
1 Day Post-Op Procedure(s) (LRB): HYSTERECTOMY ABDOMINAL WITH SALPINGECTOMY (Bilateral)  Subjective: Patient reports tolerating PO and + flatus.    Objective: I have reviewed patient's vital signs, intake and output and labs.  General: alert GI: soft, non-tender; bowel sounds normal; no masses,  no organomegaly Vaginal Bleeding: minimal  Assessment: s/p Procedure(s): HYSTERECTOMY ABDOMINAL WITH SALPINGECTOMY (Bilateral): stable  Plan: Discharge home  LOS: 1 day    Kyng Matlock S 07/09/2017, 8:29 AM

## 2017-07-10 NOTE — Discharge Summary (Signed)
NAMEJAYLNN, [name] NO.:  [id number]  MEDICAL RECORD NO.:  [id number]  LOCATION:                                 FACILITY:  PHYSICIAN:  Darlyn Chamber, M.D.   DATE OF BIRTH:  Oct 12, 1970  DATE OF ADMISSION:  07/08/2017 DATE OF DISCHARGE:  07/09/2017                              DISCHARGE SUMMARY   ADMITTING DIAGNOSIS:  Symptomatic uterine fibroids.  DISCHARGE DIAGNOSIS:  Symptomatic uterine fibroids.  OPERATIVE PROCEDURES:  Total abdominal hysterectomy with removal of both fallopian tubes.  For complete history and physical, please see dictated note.  COURSE IN THE HOSPITAL:  The patient underwent above-noted surgery. Postop, she did well.  Her postop hemoglobin was 9.4.  On her first postop day, she was tolerating a regular diet, ambulating without difficulty.  Her Foley had just been discontinued.  She was having no bleeding.  Bowel sounds were active.  Incision was clear.  In terms of complication, none were countered during her stay in the hospital.  The patient was discharged to home in stable condition.  DISPOSITION:  The patient is to avoid heavy lifting, vaginal entrance, or driving of a car.  She is to call should there be signs of infection in the incision.  She should report heavy vaginal bleeding, fever, nausea, vomiting, excessive pain.  Also, instructed on signs and symptoms of deep venous thrombosis and pulmonary embolus.  Discharged home on Percocet which she needs for pain.     Darlyn Chamber, M.D.     JSM/MEDQ  D:  07/09/2017  T:  07/10/2017  Job:  [id number]

## 2017-10-25 ENCOUNTER — Ambulatory Visit
Admission: RE | Admit: 2017-10-25 | Discharge: 2017-10-25 | Disposition: A | Discharge status: Home / Self Care | Payer: Commercial Managed Care - PPO | Source: Ambulatory Visit | Attending: Obstetrics and Gynecology | Admitting: Obstetrics and Gynecology

## 2017-10-25 DIAGNOSIS — R928 Other abnormal and inconclusive findings on diagnostic imaging of breast: Secondary | ICD-10-CM

## 2018-03-22 IMAGING — US US ART/VEN ABD/PELV/SCROTUM DOPPLER LTD
1 series · 13 of 25 positions shown · non-contrast
Comparison: Pelvic MRI 10/09/2016

CLINICAL DATA: Patient with pelvic and right lower quadrant pain
for 3 days.

EXAM:
TRANSABDOMINAL AND TRANSVAGINAL ULTRASOUND OF PELVIS
DOPPLER ULTRASOUND OF OVARIES
TECHNIQUE: Both transabdominal and transvaginal ultrasound examinations of the
pelvis were performed. Transabdominal technique was performed for
global imaging of the pelvis including uterus, ovaries, adnexal
regions, and pelvic cul-de-sac.
It was necessary to proceed with endovaginal exam following the
transabdominal exam to visualize the endometrium and adnexal
structures. Color and duplex Doppler ultrasound was utilized to
evaluate blood flow to the ovaries.

[Series 1: us art/ven abd/pelv/scrotum doppler ltd · 0.24mm/px · 13 of 77 slices shown]
[im 1/77]
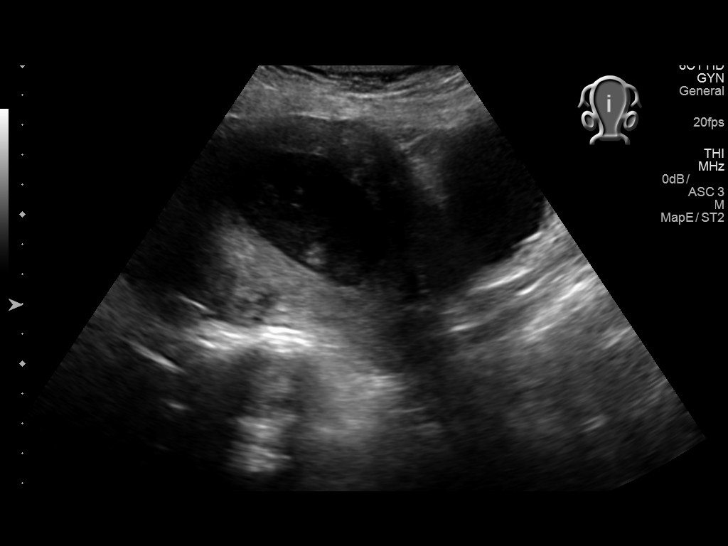
[im 7/77]
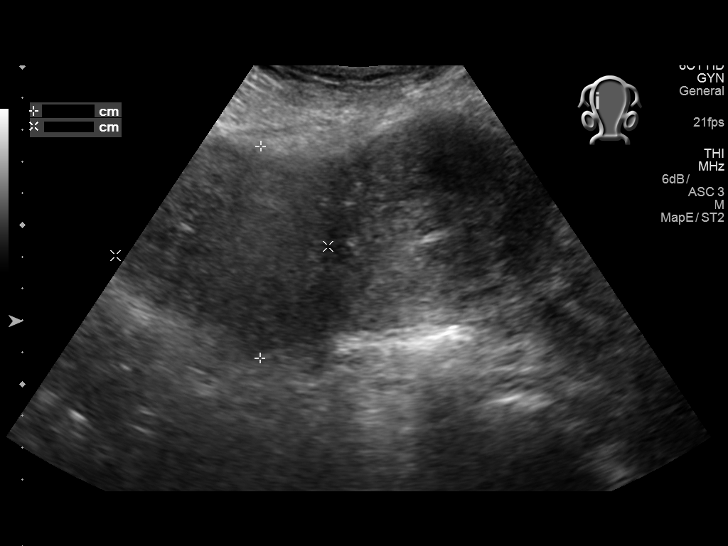
[im 13/77]
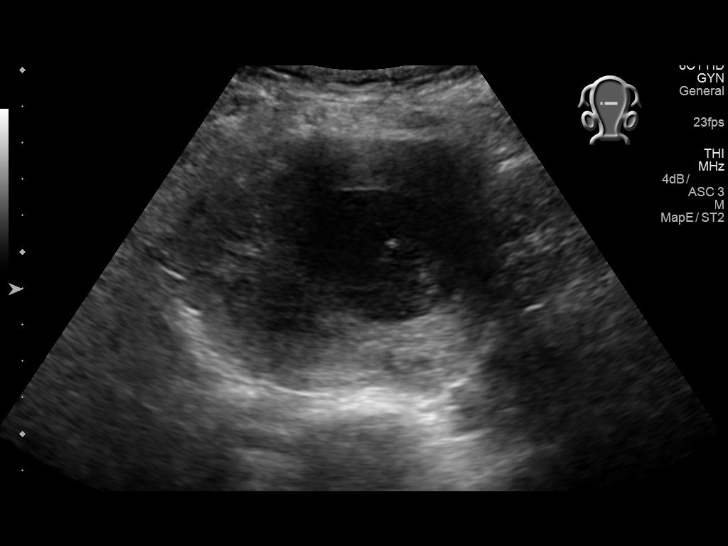
[im 20/77]
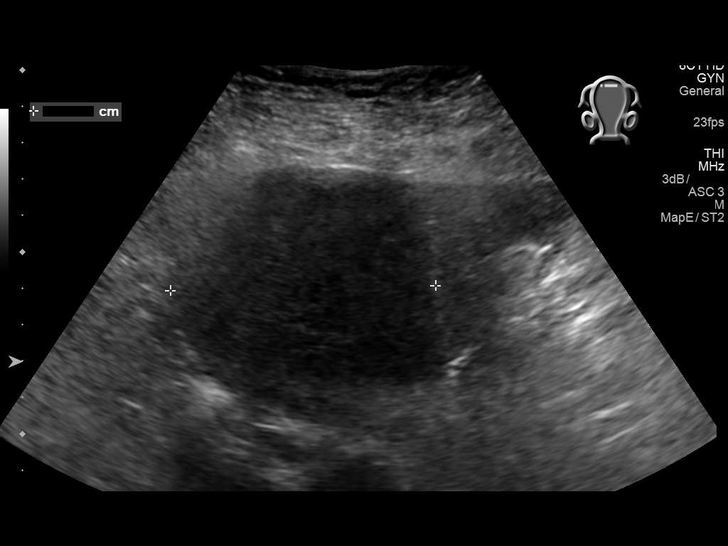
[im 26/77]
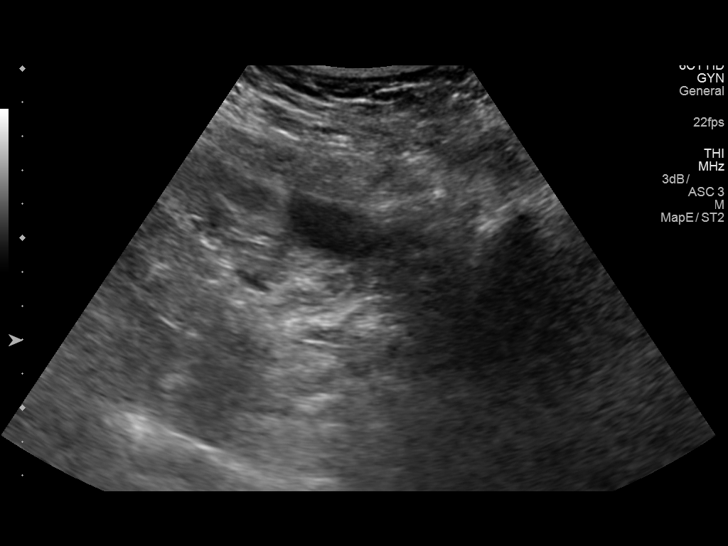
[im 32/77]
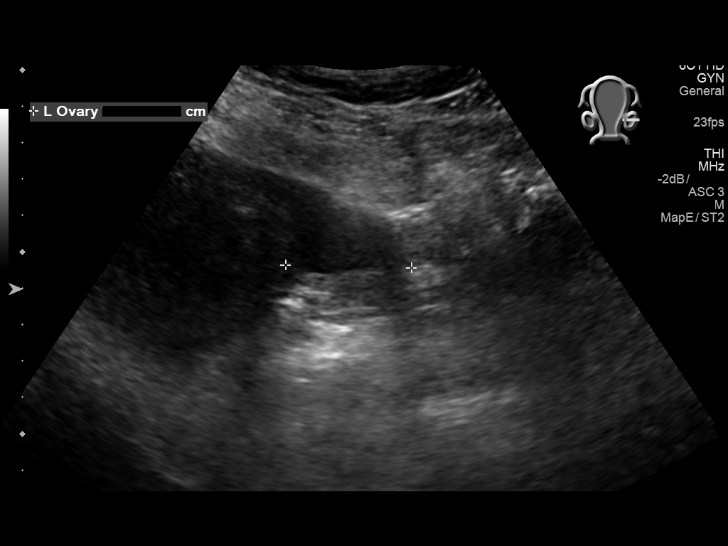
[im 39/77]
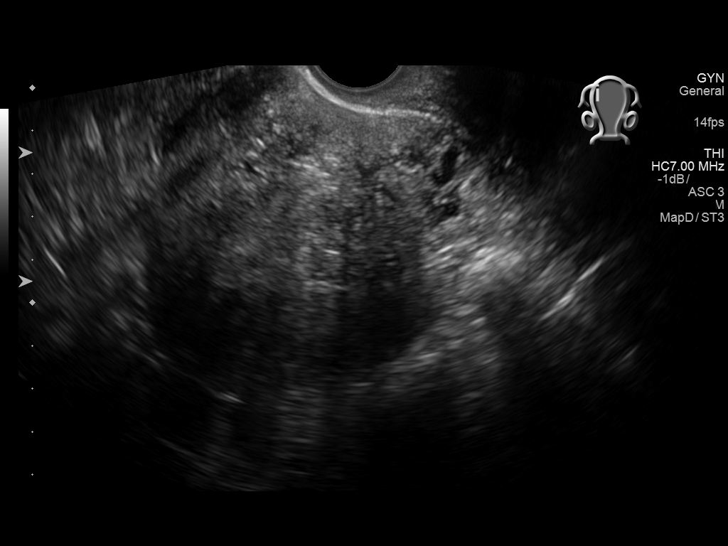
[im 45/77]
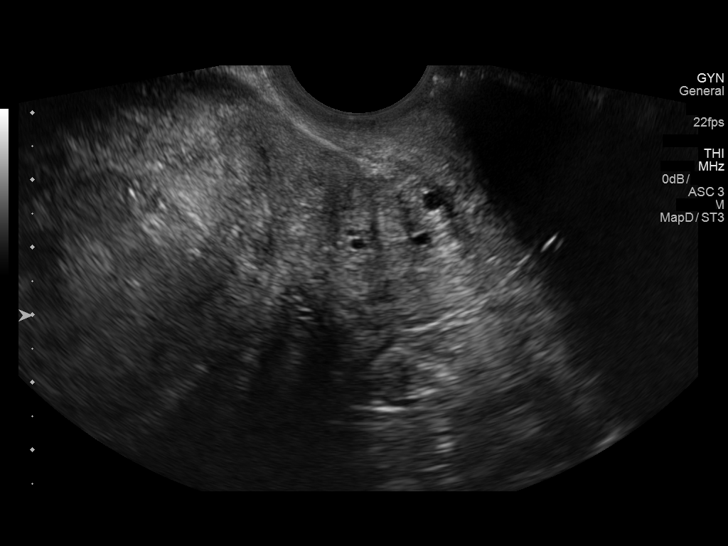
[im 51/77]
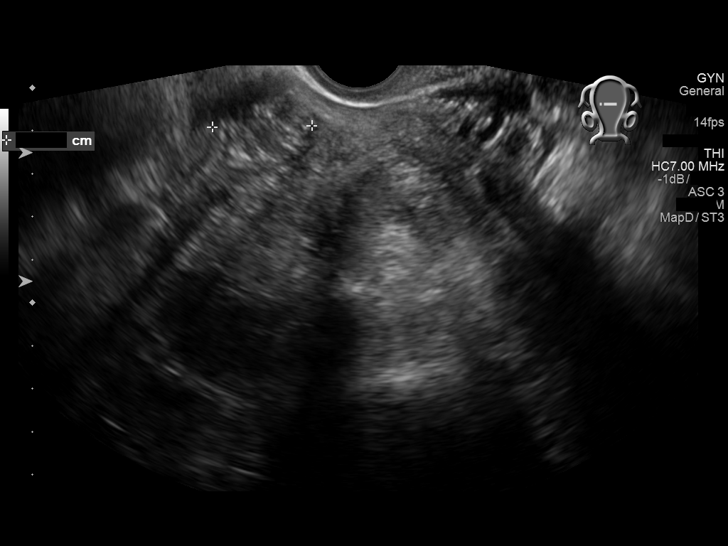
[im 58/77]
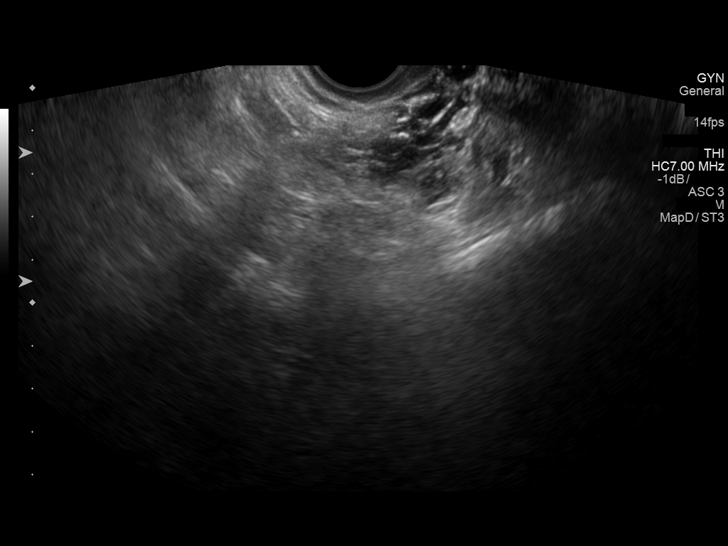
[im 64/77]
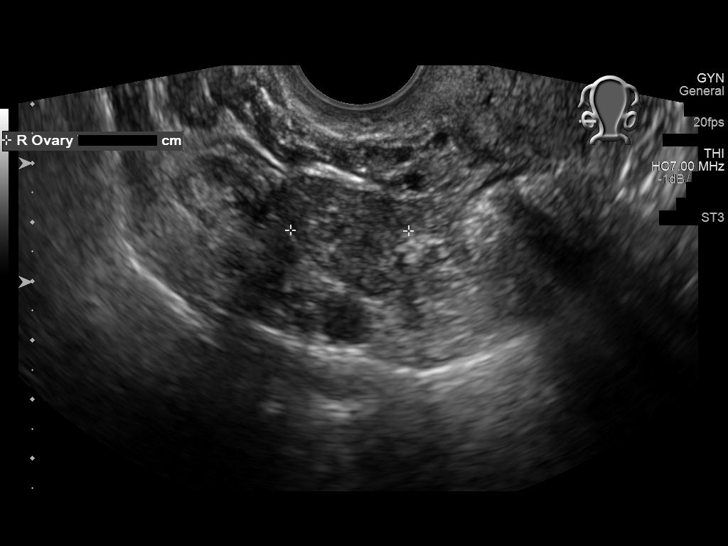
[im 70/77]
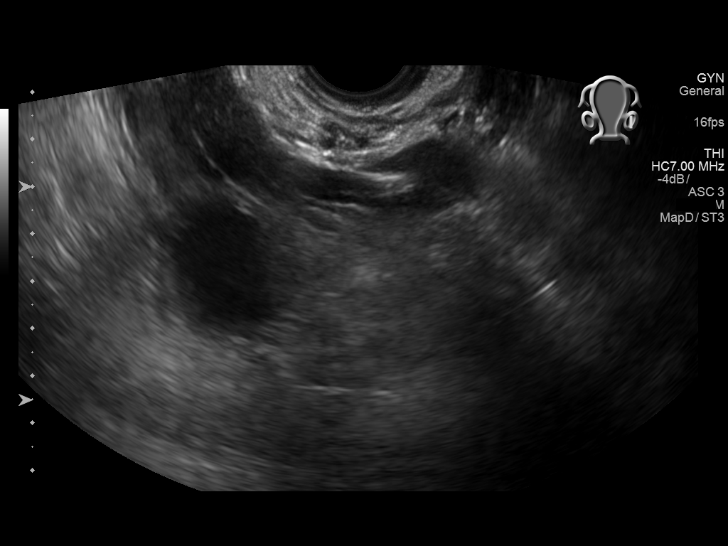
[im 77/77]
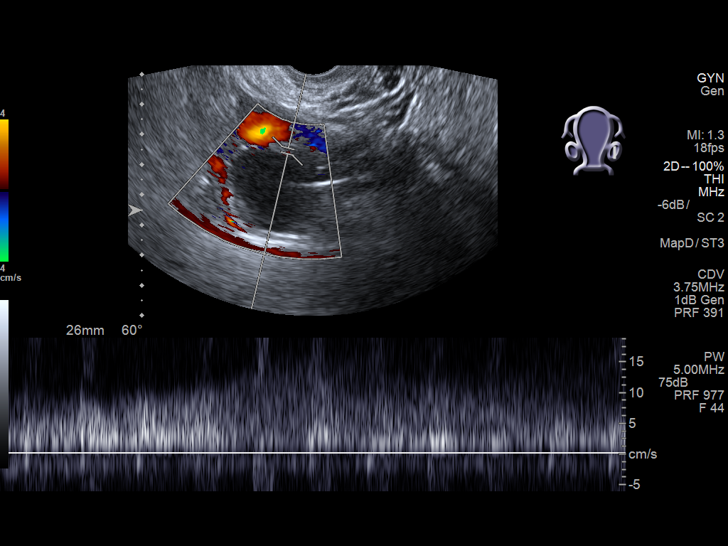

[13 of 25 positions shown; findings below may reference images not displayed]

FINDINGS: Uterus

Measurements: 11.9 x 7.9 x 8.4 cm. Multiple uterine fibroids are
demonstrated. There is a 6.1 x 6.7 x 7.3 cm partially exophytic
fibroid off the uterine fundus, a 2.6 x 2.6 x 2.3 cm fibroid within
the anterior uterine body and a 2.0 x 2.2 x 1.9 cm fibroid within
the posterior uterine body. Additional fibroids are demonstrated.

Endometrium

Thickness: 10 mm. There is mixed echogenicity material within the
endometrial canal.

Right ovary

Measurements: 2.8 x 1.7 x 2.0 cm. Normal appearance/no adnexal mass.

Left ovary

Measurements: 4.2 x 3 1 x 3.5 cm. There is a 3.1 x 2.5 x 2.0 cm cyst
within the left ovary.

Pulsed Doppler evaluation of both ovaries demonstrates normal
low-resistance arterial and venous waveforms.

Other findings

No abnormal free fluid.
IMPRESSION: 1. The endometrial canal is expanded and contains mixed echogenicity
material which may represent complex fluid or blood products.
Recommend clinical correlation to exclude the possibility of a
cervical mass as obstructing lesion.
2. Arterial and venous flow demonstrated to the right and left
ovaries. No sonographic evidence to suggest torsion.
3. Enlarged fibroid uterus.

## 2018-11-11 IMAGING — CT CT ABD-PELV W/ CM
2 of 5 series · 15 of 46 positions shown, 17 images · IV contrast (ISOVUE)
Comparison: Abdominal CT dated 02/22/2012

CLINICAL DATA: 45-year-old female with right upper quadrant
abdominal pain and suprapubic tenderness.

EXAM:
CT ABDOMEN AND PELVIS WITH CONTRAST
TECHNIQUE: Multidetector CT imaging of the abdomen and pelvis was performed
using the standard protocol following bolus administration of
intravenous contrast.
CONTRAST:  100 cc Dsovue-NPP

[Series 2: abd/pel with · axial · 0.68mm/px · z∈[-358,+27]mm · 12 of 87 slices shown, 14 images]
[im 5/87  soft-tissue]
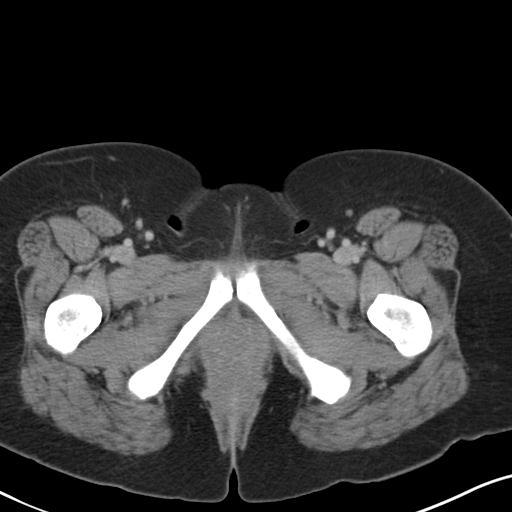
[im 5/87  bone]
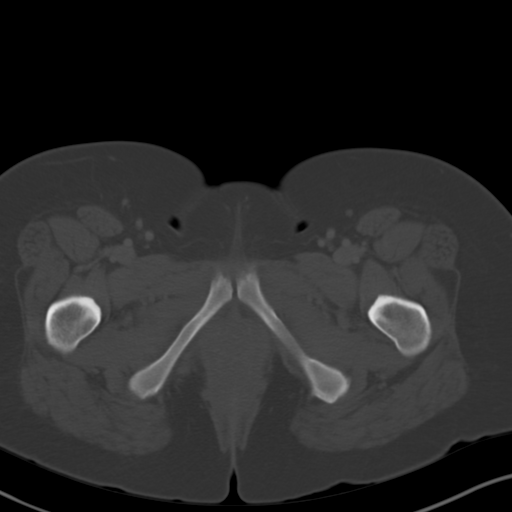
[im 14/87  soft-tissue]
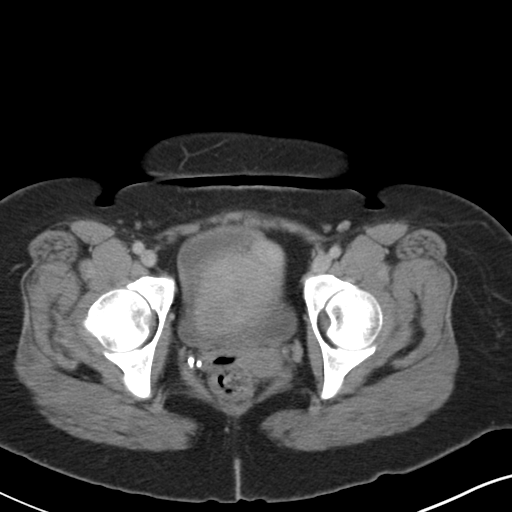
[im 19/87  soft-tissue]
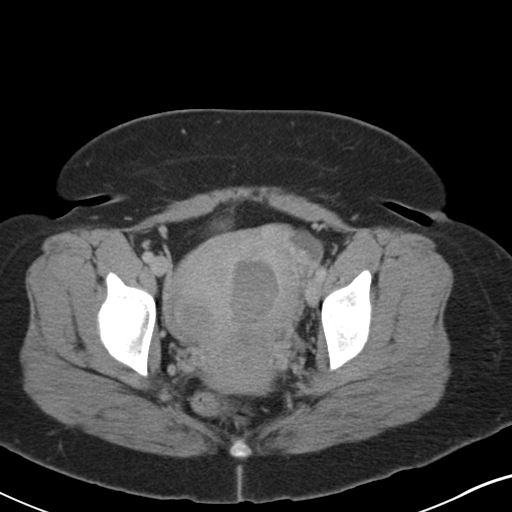
[im 28/87  soft-tissue]
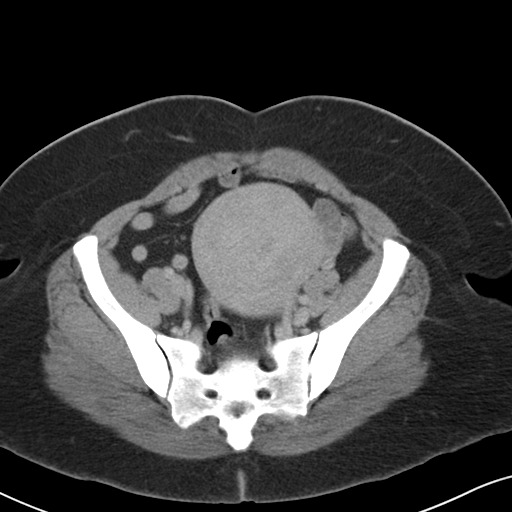
[im 32/87  soft-tissue]
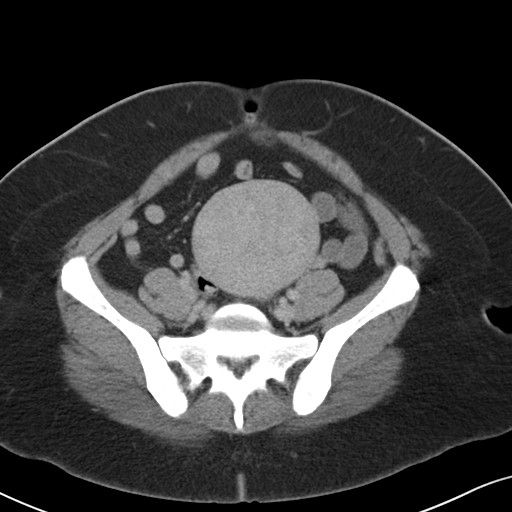
[im 41/87  soft-tissue]
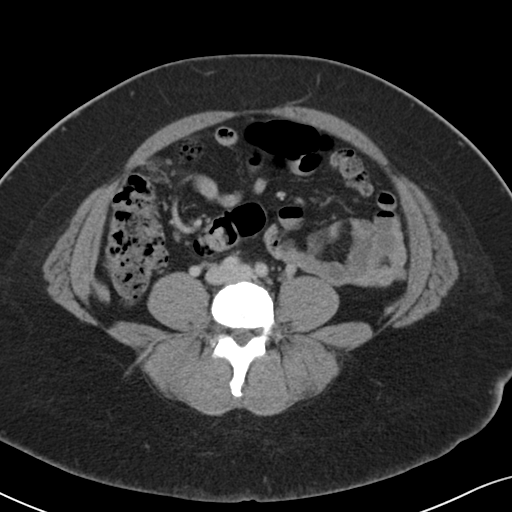
[im 46/87  soft-tissue]
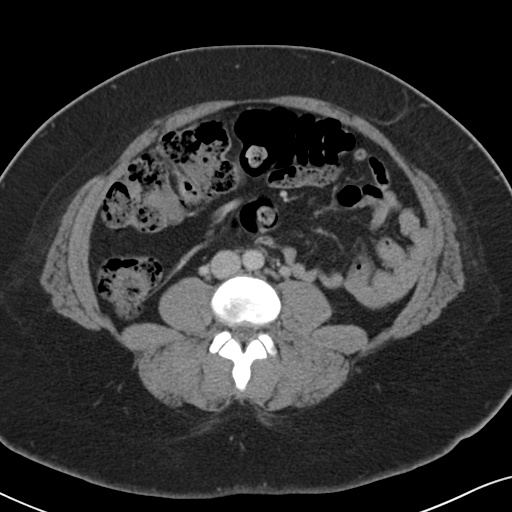
[im 55/87  soft-tissue]
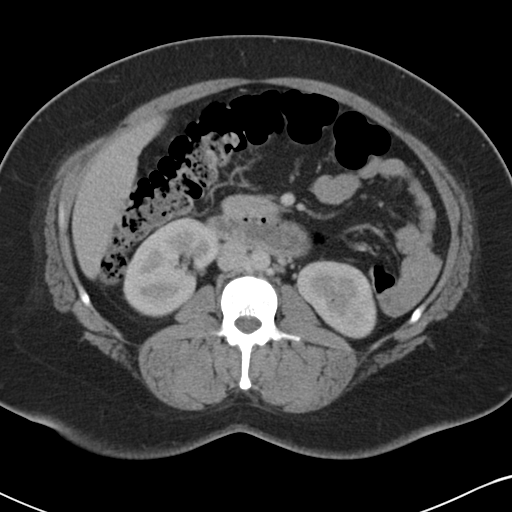
[im 59/87  soft-tissue]
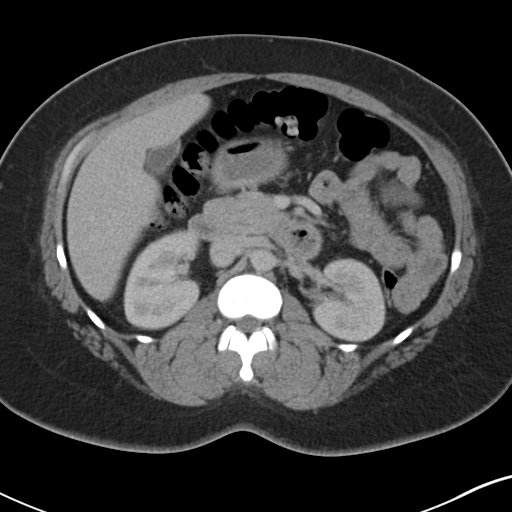
[im 59/87  bone]
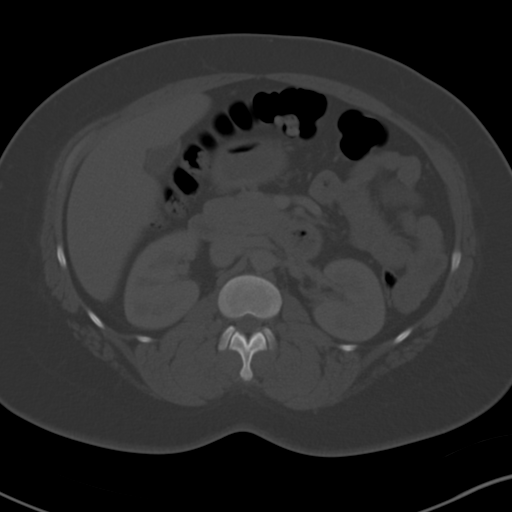
[im 68/87  soft-tissue]
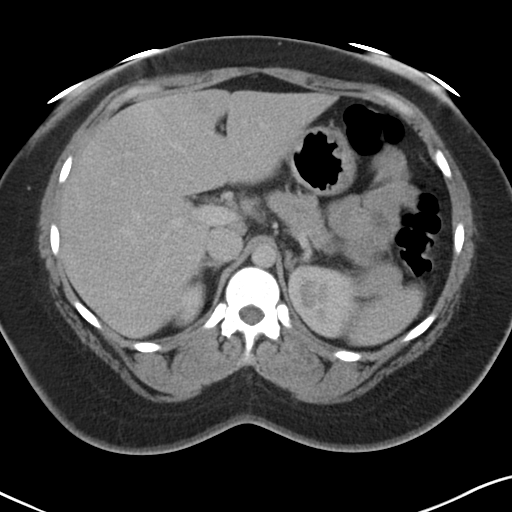
[im 73/87  soft-tissue]
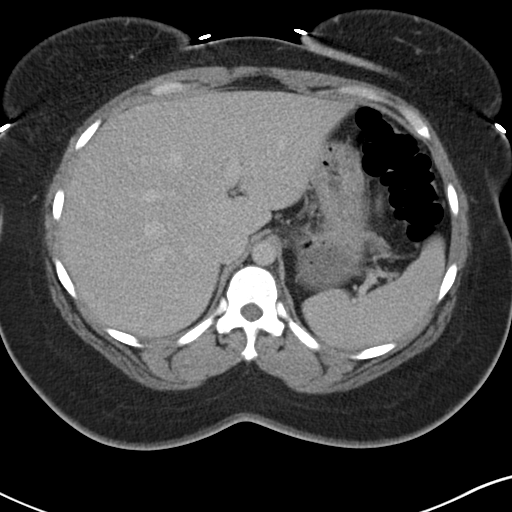
[im 82/87  soft-tissue]
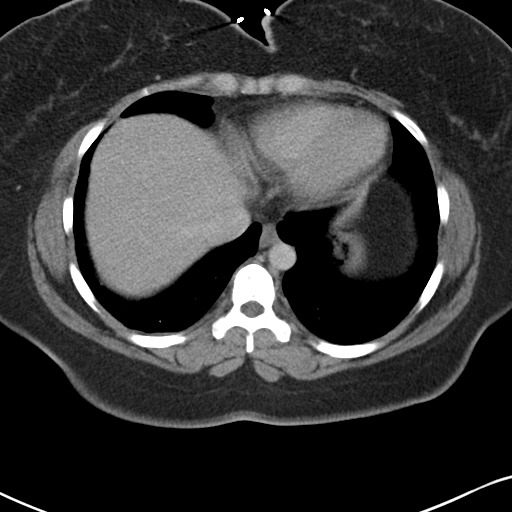

[Series 4: coronal a/|p · coronal · 0.69mm/px · 3 of 148 slices shown]
[im 50/148  soft-tissue]
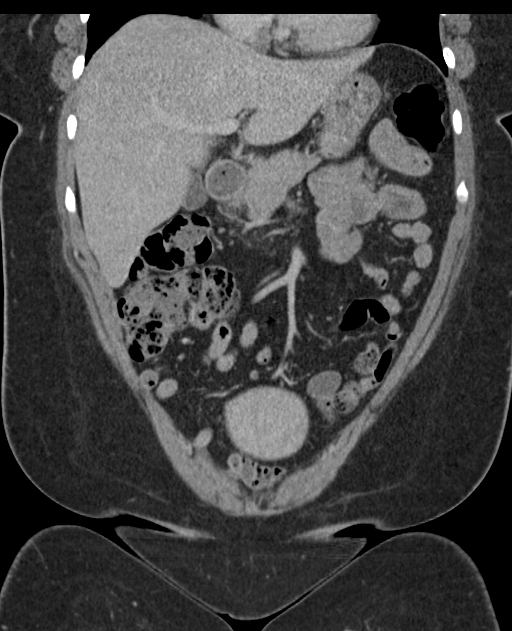
[im 66/148  soft-tissue]
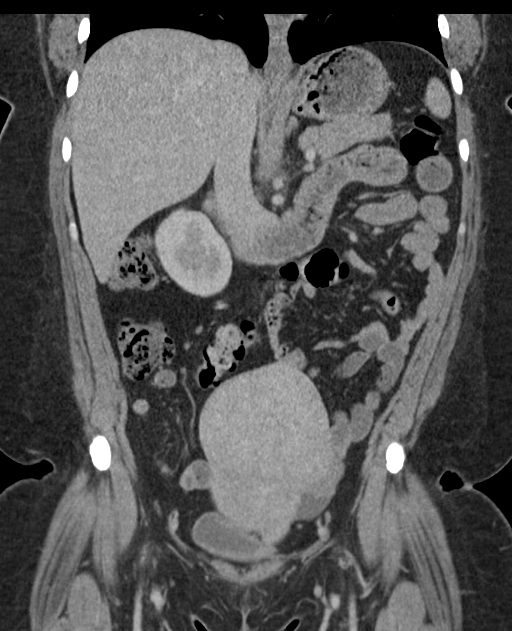
[im 82/148  soft-tissue]
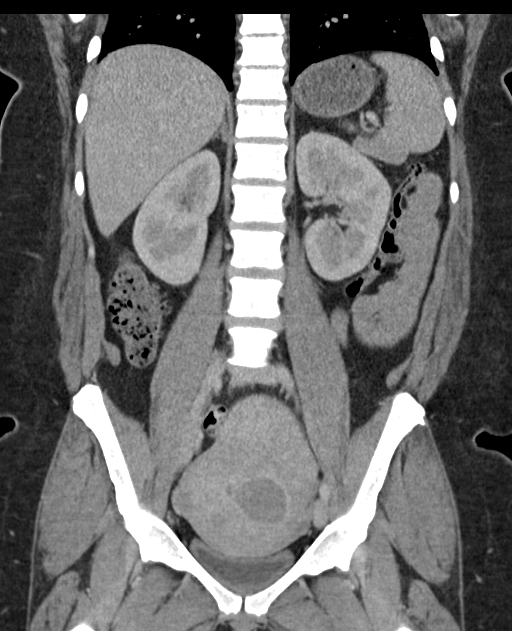

[15 of 46 positions shown; findings below may reference images not displayed]

FINDINGS: Lower chest: The visualized lung bases are clear.

No intra-abdominal free air or free fluid.

Hepatobiliary: Apparent mild fatty infiltration of the liver. No
intrahepatic biliary ductal dilatation. The gallbladder is
unremarkable.

Pancreas: Unremarkable. No pancreatic ductal dilatation or
surrounding inflammatory changes.

Spleen: Normal in size without focal abnormality.

Adrenals/Urinary Tract: There is a 3 mm nonobstructing right renal
interpolar stone. No hydronephrosis. The left kidney is
unremarkable. The visualized ureters and urinary bladder appear
unremarkable. Subcentimeter left renal hypodense lesion is too small
to characterize but likely represents a cyst.

Stomach/Bowel: There is moderate stool throughout the colon. There
is no evidence of bowel obstruction or active inflammation. The
appendix is not visualized with certainty. No inflammatory changes
identified in the right lower quadrant.

Vascular/Lymphatic: The abdominal aorta and IVC appear unremarkable.
The origins of the celiac axis, SMA, IMA appear patent. There is a
sacrum aortic left renal vein anatomy. The IVC appears unremarkable.
The SMV, splenic vein, and main portal vein are patent. No portal
venous gas identified. There is no adenopathy.

Reproductive: The uterus is enlarged and myomatous. The largest
fibroid measures approximately 8.5 x 8.5 cm in the uterine fundus. A
3.4 x 4.9 cm ovoid low attenuating area within the uterus likely
represents a degenerative fibroid versus fluid within the dilated
endometrial canal. Further evaluation with pelvic ultrasound is
recommended. Underlying endometrial outflow obstruction is not
entirely excluded. The ovaries appear unremarkable.

Other: There is diastases of anterior abdominal wall musculature in
the midline.

Musculoskeletal: No acute or significant osseous findings.
IMPRESSION: 1. No acute intra-abdominal or pelvic pathology.
2. Enlarged myomatous uterus with possible endometrial outflow
obstruction. Further evaluation with ultrasound or MRI recommended.
3. A 3 mm nonobstructing right renal interpolar stone. No
hydronephrosis.
4. Mild fatty infiltration of the liver.

## 2020-07-29 ENCOUNTER — Ambulatory Visit: Payer: Commercial Managed Care - PPO | Admitting: Nurse Practitioner
